# Patient Record
Sex: Female | Born: 1955 | Race: White | Hispanic: No | Smoking: Never smoker
Health system: Southern US, Community
[De-identification: ages and names within clinical notes are randomized; demographics above are authoritative.]

## PROBLEM LIST (undated history)

## (undated) DIAGNOSIS — C50919 Malignant neoplasm of unspecified site of unspecified female breast: Principal | ICD-10-CM

## (undated) DIAGNOSIS — F419 Anxiety disorder, unspecified: Secondary | ICD-10-CM

## (undated) DIAGNOSIS — E785 Hyperlipidemia, unspecified: Secondary | ICD-10-CM

## (undated) DIAGNOSIS — M1990 Unspecified osteoarthritis, unspecified site: Secondary | ICD-10-CM

## (undated) DIAGNOSIS — K219 Gastro-esophageal reflux disease without esophagitis: Secondary | ICD-10-CM

## (undated) DIAGNOSIS — C55 Malignant neoplasm of uterus, part unspecified: Secondary | ICD-10-CM

## (undated) DIAGNOSIS — Z923 Personal history of irradiation: Secondary | ICD-10-CM

## (undated) HISTORY — DX: Unspecified osteoarthritis, unspecified site: M19.90

## (undated) HISTORY — DX: Hyperlipidemia, unspecified: E78.5

## (undated) HISTORY — PX: FINGER SURGERY: SHX640

## (undated) HISTORY — PX: ABDOMINAL HYSTERECTOMY: SHX81

## (undated) HISTORY — DX: Malignant neoplasm of uterus, part unspecified: C55

## (undated) HISTORY — DX: Gastro-esophageal reflux disease without esophagitis: K21.9

## (undated) HISTORY — DX: Anxiety disorder, unspecified: F41.9

## (undated) HISTORY — PX: CHOLECYSTECTOMY: SHX55

## (undated) HISTORY — DX: Malignant neoplasm of unspecified site of unspecified female breast: C50.919

---

## 1999-02-18 ENCOUNTER — Encounter: Admission: RE | Admit: 1999-02-18 | Discharge: 1999-02-18 | Payer: Self-pay | Admitting: Obstetrics and Gynecology

## 2000-03-22 ENCOUNTER — Encounter: Admission: RE | Admit: 2000-03-22 | Discharge: 2000-03-22 | Payer: Self-pay | Admitting: Obstetrics and Gynecology

## 2000-03-22 ENCOUNTER — Encounter: Payer: Self-pay | Admitting: Obstetrics and Gynecology

## 2000-08-10 ENCOUNTER — Ambulatory Visit (HOSPITAL_COMMUNITY): Admission: RE | Admit: 2000-08-10 | Discharge: 2000-08-10 | Payer: Self-pay | Admitting: Internal Medicine

## 2000-08-10 ENCOUNTER — Encounter: Payer: Self-pay | Admitting: Internal Medicine

## 2001-07-24 ENCOUNTER — Ambulatory Visit (HOSPITAL_COMMUNITY): Admission: RE | Admit: 2001-07-24 | Discharge: 2001-07-24 | Payer: Self-pay | Admitting: *Deleted

## 2004-10-09 ENCOUNTER — Other Ambulatory Visit: Admission: RE | Admit: 2004-10-09 | Discharge: 2004-10-09 | Payer: Self-pay

## 2004-10-20 ENCOUNTER — Encounter: Admission: RE | Admit: 2004-10-20 | Discharge: 2004-10-20 | Payer: Self-pay

## 2005-03-08 ENCOUNTER — Emergency Department (HOSPITAL_COMMUNITY): Admission: EM | Admit: 2005-03-08 | Discharge: 2005-03-08 | Payer: Self-pay | Admitting: Emergency Medicine

## 2005-04-21 ENCOUNTER — Encounter (INDEPENDENT_AMBULATORY_CARE_PROVIDER_SITE_OTHER): Payer: Self-pay | Admitting: Specialist

## 2005-04-21 ENCOUNTER — Ambulatory Visit (HOSPITAL_COMMUNITY): Admission: RE | Admit: 2005-04-21 | Discharge: 2005-04-21 | Payer: Self-pay

## 2007-05-11 DIAGNOSIS — C50919 Malignant neoplasm of unspecified site of unspecified female breast: Secondary | ICD-10-CM

## 2007-05-11 DIAGNOSIS — Z923 Personal history of irradiation: Secondary | ICD-10-CM

## 2007-05-11 HISTORY — PX: BREAST LUMPECTOMY: SHX2

## 2007-05-11 HISTORY — DX: Malignant neoplasm of unspecified site of unspecified female breast: C50.919

## 2007-05-11 HISTORY — DX: Personal history of irradiation: Z92.3

## 2007-10-03 ENCOUNTER — Encounter: Admission: RE | Admit: 2007-10-03 | Discharge: 2007-10-03 | Payer: Self-pay | Admitting: Obstetrics and Gynecology

## 2007-10-03 ENCOUNTER — Encounter (INDEPENDENT_AMBULATORY_CARE_PROVIDER_SITE_OTHER): Payer: Self-pay | Admitting: Diagnostic Radiology

## 2007-10-12 ENCOUNTER — Encounter: Admission: RE | Admit: 2007-10-12 | Discharge: 2007-10-12 | Payer: Self-pay | Admitting: Obstetrics and Gynecology

## 2007-10-20 ENCOUNTER — Ambulatory Visit (HOSPITAL_BASED_OUTPATIENT_CLINIC_OR_DEPARTMENT_OTHER): Admission: RE | Admit: 2007-10-20 | Discharge: 2007-10-20 | Payer: Self-pay | Admitting: General Surgery

## 2007-10-20 ENCOUNTER — Encounter: Admission: RE | Admit: 2007-10-20 | Discharge: 2007-10-20 | Payer: Self-pay | Admitting: General Surgery

## 2007-10-20 ENCOUNTER — Encounter (HOSPITAL_BASED_OUTPATIENT_CLINIC_OR_DEPARTMENT_OTHER): Payer: Self-pay | Admitting: General Surgery

## 2007-11-16 ENCOUNTER — Ambulatory Visit: Payer: Self-pay | Admitting: Oncology

## 2007-11-27 ENCOUNTER — Ambulatory Visit: Admission: RE | Admit: 2007-11-27 | Discharge: 2008-01-31 | Payer: Self-pay | Admitting: Radiation Oncology

## 2007-12-11 ENCOUNTER — Encounter: Admission: RE | Admit: 2007-12-11 | Discharge: 2007-12-11 | Payer: Self-pay | Admitting: Radiation Oncology

## 2008-03-04 ENCOUNTER — Ambulatory Visit: Payer: Self-pay | Admitting: Oncology

## 2008-04-29 ENCOUNTER — Ambulatory Visit: Payer: Self-pay | Admitting: Oncology

## 2008-09-16 ENCOUNTER — Encounter: Admission: RE | Admit: 2008-09-16 | Discharge: 2008-09-16 | Payer: Self-pay | Admitting: Oncology

## 2008-12-23 ENCOUNTER — Ambulatory Visit: Payer: Self-pay | Admitting: Oncology

## 2008-12-23 LAB — COMPREHENSIVE METABOLIC PANEL
ALT: 17 U/L (ref 0–35)
AST: 15 U/L (ref 0–37)
Chloride: 104 mEq/L (ref 96–112)
Creatinine, Ser: 0.72 mg/dL (ref 0.40–1.20)
Glucose, Bld: 86 mg/dL (ref 70–99)
Potassium: 4.1 mEq/L (ref 3.5–5.3)
Sodium: 138 mEq/L (ref 135–145)
Total Bilirubin: 0.5 mg/dL (ref 0.3–1.2)
Total Protein: 7.4 g/dL (ref 6.0–8.3)

## 2008-12-23 LAB — LACTATE DEHYDROGENASE: LDH: 164 U/L (ref 94–250)

## 2008-12-23 LAB — CBC WITH DIFFERENTIAL/PLATELET
BASO%: 0.4 % (ref 0.0–2.0)
HCT: 34.8 % (ref 34.8–46.6)
HGB: 11.8 g/dL (ref 11.6–15.9)
MCV: 87 fL (ref 79.5–101.0)
MONO#: 0.2 10*3/uL (ref 0.1–0.9)
NEUT#: 2.2 10*3/uL (ref 1.5–6.5)
NEUT%: 66 % (ref 38.4–76.8)
RBC: 4 10*6/uL (ref 3.70–5.45)
WBC: 3.4 10*3/uL — ABNORMAL LOW (ref 3.9–10.3)
lymph#: 0.9 10*3/uL (ref 0.9–3.3)

## 2009-01-15 LAB — ESTRADIOL, ULTRA SENS

## 2009-02-19 ENCOUNTER — Ambulatory Visit: Payer: Self-pay | Admitting: Oncology

## 2009-06-18 ENCOUNTER — Encounter: Admission: RE | Admit: 2009-06-18 | Discharge: 2009-06-18 | Payer: Self-pay | Admitting: Rheumatology

## 2009-06-25 ENCOUNTER — Ambulatory Visit: Payer: Self-pay | Admitting: Oncology

## 2009-06-27 LAB — COMPREHENSIVE METABOLIC PANEL
ALT: 22 U/L (ref 0–35)
AST: 21 U/L (ref 0–37)
Albumin: 4 g/dL (ref 3.5–5.2)
CO2: 26 mEq/L (ref 19–32)
Calcium: 9 mg/dL (ref 8.4–10.5)
Chloride: 106 mEq/L (ref 96–112)
Potassium: 4 mEq/L (ref 3.5–5.3)
Sodium: 138 mEq/L (ref 135–145)
Total Bilirubin: 0.7 mg/dL (ref 0.3–1.2)

## 2009-06-27 LAB — CBC WITH DIFFERENTIAL/PLATELET
EOS%: 0.6 % (ref 0.0–7.0)
HCT: 34.1 % — ABNORMAL LOW (ref 34.8–46.6)
LYMPH%: 26.4 % (ref 14.0–49.7)
MCHC: 34.2 g/dL (ref 31.5–36.0)
NEUT#: 3.1 10*3/uL (ref 1.5–6.5)
NEUT%: 65.8 % (ref 38.4–76.8)
WBC: 4.7 10*3/uL (ref 3.9–10.3)

## 2009-07-03 LAB — ESTRADIOL, ULTRA SENS: Estradiol, Ultra Sensitive: 2 pg/mL

## 2009-09-15 ENCOUNTER — Encounter: Admission: RE | Admit: 2009-09-15 | Discharge: 2009-09-15 | Payer: Self-pay | Admitting: Oncology

## 2010-05-31 ENCOUNTER — Encounter: Payer: Self-pay | Admitting: Obstetrics and Gynecology

## 2010-07-02 ENCOUNTER — Other Ambulatory Visit: Payer: Self-pay | Admitting: Oncology

## 2010-07-02 ENCOUNTER — Encounter (HOSPITAL_BASED_OUTPATIENT_CLINIC_OR_DEPARTMENT_OTHER): Payer: BC Managed Care – PPO | Admitting: Oncology

## 2010-07-02 DIAGNOSIS — D0590 Unspecified type of carcinoma in situ of unspecified breast: Secondary | ICD-10-CM

## 2010-07-02 DIAGNOSIS — Z170 Estrogen receptor positive status [ER+]: Secondary | ICD-10-CM

## 2010-07-02 LAB — COMPREHENSIVE METABOLIC PANEL WITH GFR
ALT: 22 U/L (ref 0–35)
AST: 20 U/L (ref 0–37)
Albumin: 4.1 g/dL (ref 3.5–5.2)
Alkaline Phosphatase: 78 U/L (ref 39–117)
BUN: 12 mg/dL (ref 6–23)
CO2: 27 meq/L (ref 19–32)
Calcium: 9 mg/dL (ref 8.4–10.5)
Chloride: 103 meq/L (ref 96–112)
Creatinine, Ser: 0.8 mg/dL (ref 0.40–1.20)
Glucose, Bld: 91 mg/dL (ref 70–99)
Potassium: 4.1 meq/L (ref 3.5–5.3)
Sodium: 138 meq/L (ref 135–145)
Total Bilirubin: 0.7 mg/dL (ref 0.3–1.2)
Total Protein: 7.3 g/dL (ref 6.0–8.3)

## 2010-07-02 LAB — CBC WITH DIFFERENTIAL/PLATELET
EOS%: 0.9 % (ref 0.0–7.0)
Eosinophils Absolute: 0 10*3/uL (ref 0.0–0.5)
HCT: 36 % (ref 34.8–46.6)
LYMPH%: 31.1 % (ref 14.0–49.7)
MCV: 88.7 fL (ref 79.5–101.0)
MONO#: 0.3 10*3/uL (ref 0.1–0.9)
MONO%: 6.1 % (ref 0.0–14.0)
NEUT#: 2.7 10*3/uL (ref 1.5–6.5)
NEUT%: 61.7 % (ref 38.4–76.8)
WBC: 4.4 10*3/uL (ref 3.9–10.3)
lymph#: 1.4 10*3/uL (ref 0.9–3.3)

## 2010-07-02 LAB — LACTATE DEHYDROGENASE: LDH: 139 U/L (ref 94–250)

## 2010-08-11 ENCOUNTER — Encounter (HOSPITAL_BASED_OUTPATIENT_CLINIC_OR_DEPARTMENT_OTHER): Payer: BC Managed Care – PPO | Admitting: Oncology

## 2010-08-11 DIAGNOSIS — G8929 Other chronic pain: Secondary | ICD-10-CM

## 2010-08-11 DIAGNOSIS — D0590 Unspecified type of carcinoma in situ of unspecified breast: Secondary | ICD-10-CM

## 2010-08-11 DIAGNOSIS — Z170 Estrogen receptor positive status [ER+]: Secondary | ICD-10-CM

## 2010-08-12 ENCOUNTER — Other Ambulatory Visit: Payer: Self-pay | Admitting: Oncology

## 2010-08-12 DIAGNOSIS — Z9889 Other specified postprocedural states: Secondary | ICD-10-CM

## 2010-09-17 ENCOUNTER — Other Ambulatory Visit: Payer: Self-pay | Admitting: Oncology

## 2010-09-17 ENCOUNTER — Ambulatory Visit
Admission: RE | Admit: 2010-09-17 | Discharge: 2010-09-17 | Disposition: A | Discharge status: Home / Self Care | Payer: BC Managed Care – PPO | Source: Ambulatory Visit | Attending: Oncology | Admitting: Oncology

## 2010-09-17 DIAGNOSIS — Z9889 Other specified postprocedural states: Secondary | ICD-10-CM

## 2010-09-17 DIAGNOSIS — C50919 Malignant neoplasm of unspecified site of unspecified female breast: Secondary | ICD-10-CM

## 2010-09-22 NOTE — Op Note (Signed)
NAMESHANICA, CASTELLANOS            ACCOUNT NO.:  [id number]   MEDICAL RECORD NO.:  [id number]          PATIENT TYPE:  AMB   LOCATION:  DSC                          FACILITY:  [facility]   PHYSICIAN:  Leonie Man, M.D.   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   PROCEDURE:  Needle localized right lumpectomy.   SURGEON:  Leonie Man, MD   ASSISTANT:  OR nurse.   ANESTHESIA:  General.   SPECIMENS:  To lab, right breast tissue.   ESTIMATED BLOOD LOSS:  Minimal.   COMPLICATIONS:  There were no complications.  The patient returned to  the PACU in excellent condition.   INDICATIONS:  Ms. 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  Subsequent MRI did  not demonstrate abnormal lesions.  The proliferation markers on this  tumor shows to be highly estrogen and progesterone receptor positive.   The patient comes now for needle localized excision of this tumor in  situ.  She is aware of the risks and potential benefits of surgery and  gives her consent to same.   PROCEDURE:  Following induction of satisfactory general anesthesia with  the patient positioned supinely, the right breast was prepped and draped  to be included in a sterile operative field.  Positive identification of  the patient as [place] Vierling and the operation to be right breast  excision was carried out.  The localizing needle was observed to enter  from the medial aspect of the breast extending into the central portion  of the breast and the upper inner quadrant.  I  made a transverse  incision extending from the needle medially, deepening this through skin  and subcutaneous tissue and raising a superior medial, inferior, and  lateral flaps, so as to have a complete excision of the mass.  This  excision was carried all the way down to the chest wall.  The localizing  needle was also embedded into the pectoralis major muscle and this was  simply removed at that time.  I placed a long suture on the medial  aspect of the specimen and a double suture on the inferior aspect of the  specimen.  Specimen mammography shows the clipped to be well within and  inside the removed specimen and the lesion could be identified.  This  was then forwarded to radiology identify to for pathologic evaluation.  Hemostasis within the wound was assured with electrocautery.  Sponge and  instrument counts verified.  The breast tissues were reapproximated with  interrupted 2-0  Vicryl sutures.  Subcutaneous tissues closed with 3-0 Vicryl suture and  the skin closed with 5-0 Monocryl and then reinforced with Dermabond  and  Steri-Strips.  The patient has a history of latex allergy and it should  be noted that all latex allergic procedures were carried out during this  process procedure.        Leonie Man, M.D.  Electronically Signed     PB/MEDQ  D:  10/20/2007  T:  10/21/2007  Job:  [id number]

## 2011-02-04 LAB — DIFFERENTIAL
Basophils Absolute: 0
Basophils Relative: 1
Eosinophils Relative: 1
Lymphocytes Relative: 35
Monocytes Absolute: 0.3
Monocytes Relative: 6
Neutro Abs: 2.5

## 2011-02-04 LAB — CBC
MCHC: 34.4
MCV: 90.4
RBC: 4
RDW: 13.6

## 2011-02-04 LAB — COMPREHENSIVE METABOLIC PANEL
AST: 21
Albumin: 3.9
Alkaline Phosphatase: 61
Chloride: 108
GFR calc Af Amer: >60
Potassium: 4.7
Sodium: 140
Total Bilirubin: 0.6
Total Protein: 7

## 2011-02-15 ENCOUNTER — Other Ambulatory Visit: Payer: Self-pay | Admitting: Dermatology

## 2011-06-21 ENCOUNTER — Other Ambulatory Visit: Payer: Self-pay | Admitting: Certified Registered Nurse Anesthetist

## 2011-06-21 DIAGNOSIS — C50919 Malignant neoplasm of unspecified site of unspecified female breast: Secondary | ICD-10-CM

## 2011-06-21 MED ORDER — BUPROPION HCL ER (XL) 150 MG PO TB24: 150.0000 mg | ORAL_TABLET | Freq: Every day | ORAL | Status: DC

## 2011-06-21 MED ORDER — ANASTROZOLE 1 MG PO TABS: 1.0000 mg | ORAL_TABLET | Freq: Every day | ORAL | Status: DC

## 2011-07-28 ENCOUNTER — Other Ambulatory Visit: Payer: Self-pay | Admitting: Oncology

## 2011-07-28 DIAGNOSIS — Z853 Personal history of malignant neoplasm of breast: Secondary | ICD-10-CM

## 2011-07-28 DIAGNOSIS — Z9889 Other specified postprocedural states: Secondary | ICD-10-CM

## 2011-08-05 ENCOUNTER — Other Ambulatory Visit: Payer: BC Managed Care – PPO | Admitting: Lab

## 2011-08-12 ENCOUNTER — Telehealth: Payer: Self-pay | Admitting: *Deleted

## 2011-08-12 ENCOUNTER — Ambulatory Visit: Payer: BC Managed Care – PPO | Admitting: Physician Assistant

## 2011-08-12 NOTE — Telephone Encounter (Signed)
patient called in and confirmed her appointment over the phone on 08-12-2011

## 2011-08-19 ENCOUNTER — Telehealth: Payer: Self-pay | Admitting: *Deleted

## 2011-08-19 ENCOUNTER — Ambulatory Visit (HOSPITAL_BASED_OUTPATIENT_CLINIC_OR_DEPARTMENT_OTHER): Payer: BC Managed Care – PPO | Admitting: Physician Assistant

## 2011-08-19 ENCOUNTER — Ambulatory Visit (HOSPITAL_BASED_OUTPATIENT_CLINIC_OR_DEPARTMENT_OTHER): Payer: BC Managed Care – PPO

## 2011-08-19 VITALS — BP 123/77 | HR 69 | Temp 98.3°F | Ht 67.0 in | Wt 162.0 lb

## 2011-08-19 DIAGNOSIS — C50911 Malignant neoplasm of unspecified site of right female breast: Secondary | ICD-10-CM

## 2011-08-19 DIAGNOSIS — Z79811 Long term (current) use of aromatase inhibitors: Secondary | ICD-10-CM

## 2011-08-19 DIAGNOSIS — Z170 Estrogen receptor positive status [ER+]: Secondary | ICD-10-CM

## 2011-08-19 DIAGNOSIS — D0590 Unspecified type of carcinoma in situ of unspecified breast: Secondary | ICD-10-CM

## 2011-08-19 DIAGNOSIS — E559 Vitamin D deficiency, unspecified: Secondary | ICD-10-CM

## 2011-08-19 DIAGNOSIS — C50919 Malignant neoplasm of unspecified site of unspecified female breast: Secondary | ICD-10-CM

## 2011-08-19 LAB — CBC WITH DIFFERENTIAL/PLATELET
BASO%: 0.6 % (ref 0.0–2.0)
Basophils Absolute: 0 10*3/uL (ref 0.0–0.1)
EOS%: 1.1 % (ref 0.0–7.0)
HCT: 36.8 % (ref 34.8–46.6)
HGB: 12.3 g/dL (ref 11.6–15.9)
MCH: 29.5 pg (ref 25.1–34.0)
MCHC: 33.6 g/dL (ref 31.5–36.0)
MCV: 87.9 fL (ref 79.5–101.0)
MONO%: 5.7 % (ref 0.0–14.0)
NEUT%: 61.8 % (ref 38.4–76.8)
lymph#: 1.2 10*3/uL (ref 0.9–3.3)

## 2011-08-19 NOTE — Telephone Encounter (Signed)
sent patient back to the lab on 08-19-2011 gave patient appointment for 08-2012 printed out  calendar and gave the patient

## 2011-08-19 NOTE — Progress Notes (Signed)
Hematology and Oncology Follow Up Visit  Ashley Norton [id number] 1956-03-05 56 y.o. 08/19/2011    HPI: 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 She has previously been on tamoxifen but didn't tolerate switched to Arimidex 1 mg by mouth daily. 2. 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 She continues on anastrozole 1 mg per day and is tolerating it quite well. She notes that Peridin-C continues to control her hot flashes well, though she is considering increasing it from 2 tablets to 3-4 per day. She denies any unexplained fevers, chills, or night sweats. No nausea, emesis, diarrhea or constipation issues. She is due for her annual mammogram on 09/20/2011. She occasionally has a discomfort in the lateral aspect of the right breast. Overall she feels quite well. 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 She admits that she is not able to exercise on a regular basis, but that she is on her feet very much all day.  A detailed review of systems is otherwise noncontributory as noted below.  Review of Systems: Constitutional:  no weight loss, fever, night sweats and feels well Eyes: No compliants ENT: No complaints Cardiovascular: no chest pain or dyspnea on exertion Respiratory: no cough, shortness of breath, or wheezing Neurological: no TIA or stroke symptoms Dermatological: negative Gastrointestinal: no abdominal pain, change in bowel habits, or black or bloody stools Genito-Urinary: no dysuria, trouble voiding, or hematuria Hematological and Lymphatic: negative Breast: negative Musculoskeletal: negative Remaining ROS negative.   Medications:   I have  reviewed the patient's current medications.  Current Outpatient Prescriptions  Medication Sig Dispense Refill  . anastrozole (ARIMIDEX) 1 MG tablet Take 1 mg by mouth daily.      Marland Kitchen buPROPion (WELLBUTRIN XL) 150 MG 24 hr tablet Take 1 tablet (150 mg total) by mouth daily.  90 tablet  4    Allergies: No Known Allergies  Physical Exam: Filed Vitals:   08/19/11 0946  BP: 123/77  Pulse: 69  Temp: 98.3 F (36.8 C)    Body mass index is 25.37 kg/(m^2). Weight: 162 lbs. HEENT:  Sclerae anicteric, conjunctivae pink.  Oropharynx clear.  No mucositis or candidiasis.   Nodes:  No cervical, supraclavicular, or axillary lymphadenopathy palpated.  Breast Exam: The right breast was examined, the lumpectomy scar in the central portion of the breast is benign without any dominant masses no nipple inversion or discharge. The axilla is clear. The left breast is also free of masses skin changes nipple inversion or discharge. No axillary fullness. Lungs:  Clear to auscultation bilaterally.  No crackles, rhonchi, or wheezes.   Heart:  Regular rate and rhythm.   Abdomen:  Soft, nontender.  Positive bowel sounds.  No organomegaly or masses palpated.   Musculoskeletal:  No focal spinal tenderness to palpation.  Extremities:  Benign.  No peripheral edema or cyanosis.   Skin:  Benign.   Neuro:  Nonfocal, alert and oriented x 3.   Lab Results: Lab Results  Component Value Date   WBC 4.4 07/02/2010   HGB 12.0 07/02/2010   HCT 36.0 07/02/2010   MCV 88.7 07/02/2010   PLT 251 07/02/2010   NEUTROABS 2.7 07/02/2010  Chemistry      Component Value Date/Time   NA 138 07/02/2010 1607   K 4.1 07/02/2010 1607   CL 103 07/02/2010 1607   CO2 27 07/02/2010 1607   BUN 12 07/02/2010 1607   CREATININE 0.80 07/02/2010 1607      Component Value Date/Time   CALCIUM 9.0 07/02/2010 1607   ALKPHOS 78 07/02/2010 1607   AST 20 07/02/2010 1607   ALT 22 07/02/2010 1607   BILITOT 0.7 07/02/2010 1607      No results found for  this basename: LABCA2    Assessment:  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 She has previously been on tamoxifen but didn't tolerate switched to Arimidex 1 mg by mouth daily.  No evidence of recurrent or metastatic disease. 2. History of hot flashes, on Peridin-C.  Case reviewed with Dr. Pierce Crane.   Plan:  Ashley Norton will continue on anastrozole 1 mg per day. I have provided a prescription for Peridin-C, so that she may be able to use her Medflex card. She will keep her appointment for her mammogram is scheduled in May of 2013, she will return to see [place] officially in one year's time. Also note, I will send her back to our lab today so that we may obtain a CBC, serum chemistry, LDH, CA 27-29, and vitamin D level.  This plan was reviewed with the patient, who voices understanding and agreement.  She knows to call with any changes or problems.    Ashley Norton T, PA-C 08/19/2011

## 2011-08-20 LAB — CANCER ANTIGEN 27.29: CA 27.29: 28 U/mL (ref 0–39)

## 2011-08-20 LAB — COMPREHENSIVE METABOLIC PANEL
AST: 15 U/L (ref 0–37)
Alkaline Phosphatase: 96 U/L (ref 39–117)
BUN: 12 mg/dL (ref 6–23)
Creatinine, Ser: 0.82 mg/dL (ref 0.50–1.10)
Glucose, Bld: 89 mg/dL (ref 70–99)
Total Bilirubin: 0.6 mg/dL (ref 0.3–1.2)

## 2011-08-20 LAB — VITAMIN D 25 HYDROXY (VIT D DEFICIENCY, FRACTURES): Vit D, 25-Hydroxy: 48 ng/mL (ref 30–89)

## 2011-09-20 ENCOUNTER — Ambulatory Visit
Admission: RE | Admit: 2011-09-20 | Discharge: 2011-09-20 | Disposition: A | Discharge status: Home / Self Care | Payer: BC Managed Care – PPO | Source: Ambulatory Visit | Attending: Oncology | Admitting: Oncology

## 2011-09-20 DIAGNOSIS — Z853 Personal history of malignant neoplasm of breast: Secondary | ICD-10-CM

## 2011-09-20 DIAGNOSIS — Z9889 Other specified postprocedural states: Secondary | ICD-10-CM

## 2012-04-10 ENCOUNTER — Telehealth: Payer: Self-pay | Admitting: *Deleted

## 2012-04-10 NOTE — Telephone Encounter (Signed)
Mailed out calendar to inform the patient of the new date and time md on vac 

## 2012-06-02 ENCOUNTER — Telehealth: Payer: Self-pay | Admitting: *Deleted

## 2012-06-02 ENCOUNTER — Encounter: Payer: Self-pay | Admitting: Oncology

## 2012-06-02 NOTE — Telephone Encounter (Signed)
Pt called stating that she has not received a letter, but did here that Dr. Donnie Coffin is no longer here and she needs medicine.  Confirmed 06/05/12 appt w/ pt.

## 2012-06-04 ENCOUNTER — Other Ambulatory Visit: Payer: Self-pay | Admitting: Oncology

## 2012-06-05 ENCOUNTER — Encounter: Payer: Self-pay | Admitting: Family

## 2012-06-05 ENCOUNTER — Ambulatory Visit (HOSPITAL_BASED_OUTPATIENT_CLINIC_OR_DEPARTMENT_OTHER): Payer: BC Managed Care – PPO | Admitting: Family

## 2012-06-05 ENCOUNTER — Ambulatory Visit: Payer: BC Managed Care – PPO | Admitting: Physician Assistant

## 2012-06-05 ENCOUNTER — Telehealth: Payer: Self-pay | Admitting: Oncology

## 2012-06-05 VITALS — BP 128/74 | HR 75 | Temp 98.4°F | Resp 20 | Ht 67.0 in | Wt 166.1 lb

## 2012-06-05 DIAGNOSIS — R61 Generalized hyperhidrosis: Secondary | ICD-10-CM

## 2012-06-05 DIAGNOSIS — Z853 Personal history of malignant neoplasm of breast: Secondary | ICD-10-CM | POA: Insufficient documentation

## 2012-06-05 DIAGNOSIS — D0590 Unspecified type of carcinoma in situ of unspecified breast: Secondary | ICD-10-CM

## 2012-06-05 DIAGNOSIS — C50919 Malignant neoplasm of unspecified site of unspecified female breast: Secondary | ICD-10-CM

## 2012-06-05 DIAGNOSIS — Z170 Estrogen receptor positive status [ER+]: Secondary | ICD-10-CM

## 2012-06-05 MED ORDER — ANASTROZOLE 1 MG PO TABS: 1.0000 mg | ORAL_TABLET | Freq: Every day | ORAL | Status: DC

## 2012-06-05 MED ORDER — BUPROPION HCL ER (XL) 150 MG PO TB24: 150.0000 mg | ORAL_TABLET | Freq: Every day | ORAL | Status: DC

## 2012-06-05 MED ORDER — GABAPENTIN 300 MG PO CAPS: 300.0000 mg | ORAL_CAPSULE | Freq: Every day | ORAL | Status: DC

## 2012-06-05 NOTE — Progress Notes (Signed)
ID: [name]   DOB: [birth year]  MR#: [id number]  NWG#:[id number]   PCP: Pearla Dubonnet, MD GYN: Zelphia Cairo, MD SU:  OTHER MD: Margaretmary Bayley, MD   HISTORY OF PRESENT ILLNESS: This patient has been in good health.  She undergoes annual screening mammography.  She has had multiple cyst aspirations in the past as well as biopsies of her breast.  She underwent a mammogram on 09/04/2007.  Right breast calcifications were seen at 3 o'clock position.  A biopsy was recommended.  A biopsy showed DCIS of low-grade.  This was strongly ER and PR positive at 98% and 100% respectively.  She had a followup MRI scan essentially which was unremarkable.  No evidence of abnormality was seen in either breast.  Patient underwent a needle localized lumpectomy on 10/20/2007.  Final pathology showed ductal hyperplasia and fibrocystic changes with negative surgical margins.  She has had an unremarkable postoperative course. Her subsequent course is as detailed below.   INTERVAL HISTORY: [name] was seen by Dr. 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 She continues on Anastrozole 1 mg per day and is tolerating it quite well. She continues to use Peridin-C for daytime hot flashes in addition to Bupropion.  She notes an increase in night sweats.  [name] states that she had a recent "head cold" and non-productive cough since 05/10/2012.  The patient went to [facility] for her "head cold"  and was given a prescription for Tessalon Perles and Doxycycline twice a day for 7 days. The patient states that although her symptoms have improved, they've not completely resolved. Dr. Darnelle Catalan suggested that the patient take Claritin for her ongoing symptoms.  [name] was also concerned about vaginal dryness.  Replens, K-Y jelly, Astroglide and coconut oil were all suggested as possible vaginal dryness remedies for [name].   Dr. Darnelle Catalan also offered for the patient to change breast cancer therapies back to Tamoxifen where he could order an estrogen vaginal cream. Mrs. Boer denied the latter option and states that she may try coconut oil for the vaginal dryness. [name] denies any other symptomatology during today's office visit including any breast changes, shortness of breath, fever, chills, unusual bleeding, nausea/vomiting/diarrhea or constipation.  REVIEW OF SYSTEMS: Negative except for:  General ROS: positive for  - hot flashes and night sweats ENT ROS: positive for - nasal congestion and sore throat Respiratory ROS: positive for - cough Genito-Urinary ROS: positive for - vaginal dryness MS ROS: positive for joint pain  PAST MEDICAL HISTORY: Past Medical History  Diagnosis Date  . Breast cancer 2009  . Uterine cancer     At age 75   History of herpes.  Includes previous excision of benign tumor left breast in 1993 as well as two years ago by Dr. Orson Slick.  She has had multiple cyst aspirations as noted.  She has had a gallbladder surgery by Dr. Cathey Endow a number of years ago.  In addition, she has had multiple moles removed, one from her right leg about two years ago, one from her right buttock in 2006 by Dr. Yetta Barre.  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  Abdominal hysterectomy for what sounds like early uterine cancer with intact ovaries at age 89.  PAST SURGICAL HISTORY: Past Surgical History  Procedure Date  . Cholecystectomy   . Breast lumpectomy  FAMILY HISTORY Family History  Problem Relation Age of Onset  . Hypertension Mother   . Cancer Father    Patient's father had colon cancer, died in 21.  She has two brothers in good health.  Mother, is in good health as well.  GYNECOLOGIC HISTORY: She is G1, P35, menarche age 30.  She has been on hormone replacement therapy and was using patches.    SOCIAL HISTORY: Patient is  married to her current husband for the past 31 years.  This is her second marriage.  Her daughter is from her first marriage, which lasted a year-and-a-half.  She is a grandmother.  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  Her husband is a retired Emergency planning/management officer.   ADVANCED DIRECTIVES:  Not on file.  HEALTH MAINTENANCE: History  Substance Use Topics  . Smoking status: Never Smoker   . Smokeless tobacco: Never Used  . Alcohol Use: Yes     Comment: Occasionally    No Known Allergies  Current Outpatient Prescriptions  Medication Sig Dispense Refill  . anastrozole (ARIMIDEX) 1 MG tablet Take 1 tablet (1 mg total) by mouth daily.  90 tablet  1  . buPROPion (WELLBUTRIN XL) 150 MG 24 hr tablet Take 1 tablet (150 mg total) by mouth daily.  90 tablet  4  . gabapentin (NEURONTIN) 300 MG capsule Take 1 capsule (300 mg total) by mouth at bedtime.  90 capsule  4    OBJECTIVE: Filed Vitals:   06/05/12 0841  BP: 128/74  Pulse: 75  Temp: 98.4 F (36.9 C)  Resp: 20     Body mass index is 26.01 kg/(m^2).     ECOG FS:  Grade 0 - Fully active  General appearance: Alert, cooperative, well nourished, no apparent distress Head: Normocephalic, without obvious abnormality, atraumatic Eyes: Conjunctivae/corneas clear, PERRLA, EOMI Nose: Nares, septum and mucosa are normal, no drainage or sinus tenderness. Neck: No adenopathy, supple, symmetrical, trachea midline, thyroid not enlarged, no tenderness Resp: Clear to auscultation bilaterally Cardio: Regular rate and rhythm, S1, S2 normal, no murmur, click, rub or gallop Breasts:  Soft bilaterally, well-healed right breast lumpectomy scar, no lymphadenopathy, no nipple inversion, no axilla fullness GI: Soft, distended, non-tender, hypoactive bowel sounds, no organomegaly Extremities: Extremities normal, atraumatic, no cyanosis or edema Lymph nodes: Cervical, supraclavicular, and axillary nodes normal Neurologic:  Grossly normal    LAB RESULTS: Lab Results  Component Value Date   WBC 4.0 08/19/2011   NEUTROABS 2.5 08/19/2011   HGB 12.3 08/19/2011   HCT 36.8 08/19/2011   MCV 87.9 08/19/2011   PLT 251 08/19/2011      Chemistry      Component Value Date/Time   NA 136 08/19/2011 1118   K 4.4 08/19/2011 1118   CL 103 08/19/2011 1118   CO2 27 08/19/2011 1118   BUN 12 08/19/2011 1118   CREATININE 0.82 08/19/2011 1118      Component Value Date/Time   CALCIUM 9.4 08/19/2011 1118   ALKPHOS 96 08/19/2011 1118   AST 15 08/19/2011 1118   ALT 21 08/19/2011 1118   BILITOT 0.6 08/19/2011 1118      Lab Results  Component Value Date   LABCA2 28 08/19/2011    Urinalysis No results found for this basename: colorurine, appearanceur, labspec, phurine, glucoseu, hgbur, bilirubinur, ketonesur, proteinur, urobilinogen, nitrite, leukocytesur    STUDIES: No results found.  ASSESSMENT: 57 y.o. [place] woman  (1) status post right breast biopsy Oct 03 2007 for  ductal carcinoma in situ, low-grade, estrogen receptor 98% positive and progesterone receptor 100% positive   (2)  definitive right lumpectomy 10/20/2007 showed no additional tumor  (3) adjuvant radiation completed 01/18/2008    PLAN: (1) [name] will continue on Arimidex 1 mg PO daily.  This medication was started in  01/2008.  She was given a refill on this medication today  #90 + 3 refills  (2) The patient was given a prescription for Gabapentin 300 mg PO QHS, #90 x 4 refills.  (3)  The patient was given a prescription for Bupropion 150 mg PO daily #90 x 4 refills.   (3)  [name]'s digital diagnostic bilateral mammogram will be due in 09/2012.  (4)  [name] is scheduled for a follow up visit with Dr. Darnelle Catalan on 01/29/2013 with labs of CMP, CBC, LDH, vitamin D and CA 27.29 to be drawn in advance of the office visit.   Mrs. Sacco is encouraged to contact us in the interim if she has any questions or concerns.    Ashley Bras  NP-C   06/05/2012

## 2012-06-05 NOTE — Telephone Encounter (Signed)
gv pt appt schedule for September.  °

## 2012-06-05 NOTE — Patient Instructions (Signed)
Please contact us at [phone] if you have any questions or concerns. 

## 2012-08-22 ENCOUNTER — Other Ambulatory Visit: Payer: BC Managed Care – PPO | Admitting: Lab

## 2012-08-22 ENCOUNTER — Ambulatory Visit: Payer: BC Managed Care – PPO | Admitting: Oncology

## 2012-08-29 ENCOUNTER — Ambulatory Visit: Payer: BC Managed Care – PPO | Admitting: Oncology

## 2012-08-29 ENCOUNTER — Other Ambulatory Visit: Payer: BC Managed Care – PPO | Admitting: Lab

## 2012-08-31 ENCOUNTER — Other Ambulatory Visit: Payer: Self-pay | Admitting: Obstetrics and Gynecology

## 2012-08-31 DIAGNOSIS — Z9889 Other specified postprocedural states: Secondary | ICD-10-CM

## 2012-08-31 DIAGNOSIS — Z853 Personal history of malignant neoplasm of breast: Secondary | ICD-10-CM

## 2012-09-01 ENCOUNTER — Other Ambulatory Visit: Payer: Self-pay | Admitting: Otolaryngology

## 2012-10-18 ENCOUNTER — Ambulatory Visit
Admission: RE | Admit: 2012-10-18 | Discharge: 2012-10-18 | Disposition: A | Discharge status: Home / Self Care | Payer: BC Managed Care – PPO | Source: Ambulatory Visit | Attending: Obstetrics and Gynecology | Admitting: Obstetrics and Gynecology

## 2012-10-18 DIAGNOSIS — Z853 Personal history of malignant neoplasm of breast: Secondary | ICD-10-CM

## 2012-10-18 DIAGNOSIS — Z9889 Other specified postprocedural states: Secondary | ICD-10-CM

## 2012-11-24 ENCOUNTER — Other Ambulatory Visit: Payer: Self-pay | Admitting: Family

## 2012-11-28 ENCOUNTER — Other Ambulatory Visit: Payer: Self-pay | Admitting: *Deleted

## 2012-11-28 DIAGNOSIS — C50919 Malignant neoplasm of unspecified site of unspecified female breast: Secondary | ICD-10-CM

## 2012-11-28 MED ORDER — BUPROPION HCL ER (XL) 150 MG PO TB24: 150.0000 mg | ORAL_TABLET | Freq: Every day | ORAL | Status: DC

## 2012-11-28 MED ORDER — PERIDIN-C 200-50-150 MG PO TABS: ORAL_TABLET | ORAL | Status: DC

## 2012-11-28 MED ORDER — ANASTROZOLE 1 MG PO TABS: 1.0000 mg | ORAL_TABLET | Freq: Every day | ORAL | Status: DC

## 2012-12-07 ENCOUNTER — Telehealth: Payer: Self-pay | Admitting: Oncology

## 2012-12-07 NOTE — Telephone Encounter (Signed)
Pt called today and moved 9/22 f/u appt to 10/28 (next available). Pt has new d/t for f/u 10/28 @ 9:30am and will keep lab appt as scheduled for 9/15.

## 2012-12-18 ENCOUNTER — Other Ambulatory Visit: Payer: Self-pay | Admitting: Gastroenterology

## 2013-01-22 ENCOUNTER — Other Ambulatory Visit (HOSPITAL_BASED_OUTPATIENT_CLINIC_OR_DEPARTMENT_OTHER): Payer: BC Managed Care – PPO

## 2013-01-22 DIAGNOSIS — C50919 Malignant neoplasm of unspecified site of unspecified female breast: Secondary | ICD-10-CM

## 2013-01-22 DIAGNOSIS — D0590 Unspecified type of carcinoma in situ of unspecified breast: Secondary | ICD-10-CM

## 2013-01-22 LAB — CBC WITH DIFFERENTIAL/PLATELET
Basophils Absolute: 0 10*3/uL (ref 0.0–0.1)
EOS%: 1.2 % (ref 0.0–7.0)
Eosinophils Absolute: 0 10*3/uL (ref 0.0–0.5)
HCT: 35.3 % (ref 34.8–46.6)
HGB: 11.9 g/dL (ref 11.6–15.9)
MCH: 29.2 pg (ref 25.1–34.0)
MONO#: 0.3 10*3/uL (ref 0.1–0.9)
NEUT#: 2.3 10*3/uL (ref 1.5–6.5)
RDW: 13.5 % (ref 11.2–14.5)
WBC: 4 10*3/uL (ref 3.9–10.3)
lymph#: 1.4 10*3/uL (ref 0.9–3.3)

## 2013-01-22 LAB — COMPREHENSIVE METABOLIC PANEL (CC13)
ALT: 18 U/L (ref 0–55)
AST: 14 U/L (ref 5–34)
Calcium: 9 mg/dL (ref 8.4–10.4)
Chloride: 108 mEq/L (ref 98–109)
Creatinine: 0.9 mg/dL (ref 0.6–1.1)
Sodium: 142 mEq/L (ref 136–145)
Total Protein: 7.3 g/dL (ref 6.4–8.3)

## 2013-01-22 LAB — VITAMIN D 25 HYDROXY (VIT D DEFICIENCY, FRACTURES): Vit D, 25-Hydroxy: 49 ng/mL (ref 30–89)

## 2013-01-22 LAB — LACTATE DEHYDROGENASE (CC13): LDH: 171 U/L (ref 125–245)

## 2013-01-29 ENCOUNTER — Ambulatory Visit: Payer: BC Managed Care – PPO | Admitting: Oncology

## 2013-02-06 ENCOUNTER — Other Ambulatory Visit: Payer: Self-pay | Admitting: Dermatology

## 2013-02-20 ENCOUNTER — Other Ambulatory Visit: Payer: Self-pay | Admitting: Dermatology

## 2013-03-06 ENCOUNTER — Ambulatory Visit (HOSPITAL_BASED_OUTPATIENT_CLINIC_OR_DEPARTMENT_OTHER): Payer: BC Managed Care – PPO | Admitting: Oncology

## 2013-03-06 ENCOUNTER — Encounter (INDEPENDENT_AMBULATORY_CARE_PROVIDER_SITE_OTHER): Payer: Self-pay

## 2013-03-06 VITALS — BP 117/71 | HR 76 | Temp 98.2°F | Resp 18 | Ht 67.0 in | Wt 164.5 lb

## 2013-03-06 DIAGNOSIS — Z170 Estrogen receptor positive status [ER+]: Secondary | ICD-10-CM

## 2013-03-06 DIAGNOSIS — C50919 Malignant neoplasm of unspecified site of unspecified female breast: Secondary | ICD-10-CM

## 2013-03-06 DIAGNOSIS — N952 Postmenopausal atrophic vaginitis: Secondary | ICD-10-CM

## 2013-03-06 DIAGNOSIS — D0590 Unspecified type of carcinoma in situ of unspecified breast: Secondary | ICD-10-CM

## 2013-03-06 NOTE — Progress Notes (Signed)
ID: Ashley Norton   DOB: 12/29/1955  MR#: [id number]  ZHY#:[id number]   PCP: Pearla Dubonnet, MD GYN: Zelphia Cairo, MD SU:  OTHER MD: Margaretmary Bayley, MD, Karlyn Agee MD   HISTORY OF PRESENT ILLNESS: This patient has been in good health.  She undergoes annual screening mammography.  She has had multiple cyst aspirations in the past as well as biopsies of her breast.  She underwent a mammogram on 09/04/2007.  Right breast calcifications were seen at 3 o'clock position.  A biopsy was recommended.  A biopsy showed DCIS of low-grade.  This was strongly ER and PR positive at 98% and 100% respectively.  She had a followup MRI scan essentially which was unremarkable.  No evidence of abnormality was seen in either breast.  Patient underwent a needle localized lumpectomy on 10/20/2007.  Final pathology showed ductal hyperplasia and fibrocystic changes with negative surgical margins.  She has had an unremarkable postoperative course. Her subsequent course is as detailed below.   INTERVAL HISTORY: Ashley Norton was seen by Dr. 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 She continues on Anastrozole 1 mg per day and is tolerating it quite well. She continues to use Peridin-C for daytime hot flashes in addition to Bupropion.  She notes an increase in night sweats.  Ashley Norton states that she had a recent "head cold" and non-productive cough since 05/10/2012.  The patient went to [facility] for her "head cold"  and was given a prescription for Tessalon Perles and Doxycycline twice a day for 7 days. The patient states that although her symptoms have improved, they've not completely resolved. Dr. Darnelle Catalan suggested that the patient take Claritin for her ongoing symptoms.  Mrs. Cashin was also concerned about vaginal dryness.  Replens, K-Y jelly, Astroglide and coconut oil were all suggested as possible vaginal dryness remedies for  Ashley Norton.  Dr. Darnelle Catalan also offered for the patient to change breast cancer therapies back to Tamoxifen where he could order an estrogen vaginal cream. Ashley Norton denied the latter option and states that she may try coconut oil for the vaginal dryness. Ashley Norton denies any other symptomatology during today's office visit including any breast changes, shortness of breath, fever, chills, unusual bleeding, nausea/vomiting/diarrhea or constipation.  REVIEW OF SYSTEMS: Negative except for:  General ROS: positive for  - hot flashes and night sweats ENT ROS: positive for - nasal congestion and sore throat Respiratory ROS: positive for - cough Genito-Urinary ROS: positive for - vaginal dryness MS ROS: positive for joint pain  PAST MEDICAL HISTORY: Past Medical History  Diagnosis Date  . Breast cancer 2009  . Uterine cancer     At age 57   History of herpes.  Includes previous excision of benign tumor left breast in 1993 as well as two years ago by Dr. Orson Slick.  She has had multiple cyst aspirations as noted.  She has had a gallbladder surgery by Dr. Cathey Endow a number of years ago.  In addition, she has had multiple moles removed, one from her right leg about two years ago, one from her right buttock in 2006 by Dr. Yetta Barre.  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  Abdominal hysterectomy for what sounds like early uterine cancer with intact ovaries at age 42.  PAST SURGICAL HISTORY: Past Surgical History  Procedure Laterality Date  . Cholecystectomy    .  Breast lumpectomy      FAMILY HISTORY Family History  Problem Relation Age of Onset  . Hypertension Mother   . Cancer Father    Patient's father had colon cancer, died in 75.  She has two brothers in good health.  Mother, is in good health as well.  GYNECOLOGIC HISTORY: She is G1, P55, menarche age 57.  She has been on hormone replacement therapy and was using patches.     SOCIAL HISTORY: Patient is married to her current husband for the past 31 years.  This is her second marriage.  Her daughter is from her first marriage, which lasted a year-and-a-half.  She is a grandmother.  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  Her husband is a retired Emergency planning/management officer.   ADVANCED DIRECTIVES:  Not on file.  HEALTH MAINTENANCE: History  Substance Use Topics  . Smoking status: Never Smoker   . Smokeless tobacco: Never Used  . Alcohol Use: Yes     Comment: Occasionally    No Known Allergies  Current Outpatient Prescriptions  Medication Sig Dispense Refill  . anastrozole (ARIMIDEX) 1 MG tablet TAKE 1 TABLET DAILY  90 tablet  3  . Bioflavonoid Products (PERIDRIN-C) 200-50-150 MG TABS Take as directed  100 each  4  . buPROPion (WELLBUTRIN XL) 150 MG 24 hr tablet Take 1 tablet (150 mg total) by mouth daily.  90 tablet  4  . gabapentin (NEURONTIN) 300 MG capsule Take 1 capsule (300 mg total) by mouth at bedtime.  90 capsule  4   No current facility-administered medications for this visit.    OBJECTIVE: Filed Vitals:   03/06/13 0920  BP: 117/71  Pulse: 76  Temp: 98.2 F (36.8 C)  Resp: 18     Body mass index is 25.76 kg/(m^2).     ECOG FS:  Grade 0 - Fully active  General appearance: Alert, cooperative, well nourished, no apparent distress Head: Normocephalic, without obvious abnormality, atraumatic Eyes: Conjunctivae/corneas clear, PERRLA, EOMI Nose: Nares, septum and mucosa are normal, no drainage or sinus tenderness. Neck: No adenopathy, supple, symmetrical, trachea midline, thyroid not enlarged, no tenderness Resp: Clear to auscultation bilaterally Cardio: Regular rate and rhythm, S1, S2 normal, no murmur, click, rub or gallop Breasts:  Soft bilaterally, well-healed right breast lumpectomy scar, no lymphadenopathy, no nipple inversion, no axilla fullness GI: Soft, distended, non-tender, hypoactive bowel sounds,  no organomegaly Extremities: Extremities normal, atraumatic, no cyanosis or edema Lymph nodes: Cervical, supraclavicular, and axillary nodes normal Neurologic: Grossly normal    LAB RESULTS: Lab Results  Component Value Date   WBC 4.0 01/22/2013   NEUTROABS 2.3 01/22/2013   HGB 11.9 01/22/2013   HCT 35.3 01/22/2013   MCV 87.0 01/22/2013   PLT 224 01/22/2013      Chemistry      Component Value Date/Time   NA 142 01/22/2013 0802   NA 136 08/19/2011 1118   K 4.0 01/22/2013 0802   K 4.4 08/19/2011 1118   CL 103 08/19/2011 1118   CO2 26 01/22/2013 0802   CO2 27 08/19/2011 1118   BUN 11.6 01/22/2013 0802   BUN 12 08/19/2011 1118   CREATININE 0.9 01/22/2013 0802   CREATININE 0.82 08/19/2011 1118      Component Value Date/Time   CALCIUM 9.0 01/22/2013 0802   CALCIUM 9.4 08/19/2011 1118   ALKPHOS 90 01/22/2013 0802   ALKPHOS 96 08/19/2011 1118   AST 14 01/22/2013 0802   AST 15 08/19/2011  1118   ALT 18 01/22/2013 0802   ALT 21 08/19/2011 1118   BILITOT 0.37 01/22/2013 0802   BILITOT 0.6 08/19/2011 1118      Lab Results  Component Value Date   LABCA2 19 01/22/2013    Urinalysis No results found for this basename: colorurine,  appearanceur,  labspec,  phurine,  glucoseu,  hgbur,  bilirubinur,  ketonesur,  proteinur,  urobilinogen,  nitrite,  leukocytesur    STUDIES: No results found.  ASSESSMENT: 57 y.o. 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 Arimidex September 2009 to October 2014  (5) history of remote endometrial cancer, status post hysterectomy with no salpingo-oophorectomy    PLAN: Carlisle Beers has completed her adjuvant therapy and is ready to "graduate" from followup here. She is very comfortable with that  decision. However there were a couple of issues to be discussed. One was about whether she should have genetic testing given her earlier history of endometrial cancer. We will operationalized that.  The other issue relates to vaginal atrophy. Carlisle Beers has tried a variety of approaches, none of which have really helped. The only thing that will help is local estrogen preparations such as Vagifem or Estring. She is aware that hormone replacement with estrogen and progesterone does increase the risk of breast cancer, but we do not have similar data for estrogen alone--in women without a history of breast cancer estrogen replacement does not seem to increase the risk of breast cancer developing.  Unfortunately we do not have similar data for women with a history of breast cancer. Extrapolating from the data we do have, however, I am not uncomfortable with your using local estrogen preparations for her vaginal atrophy given her understanding of the lack of data as just discussed, and the fact that this is a significant quality of life issue for her. Accordingly she will discuss this further with her gynecologist.   Carlisle Beers understands she needs yearly mammography and yearly physician breast exam. I will be glad to see her at any point in the future if and when the need arises, but as of now no further routine appointments are being made for her here.  Lowella Dell  MD  03/06/2013

## 2013-03-07 ENCOUNTER — Telehealth: Payer: Self-pay | Admitting: *Deleted

## 2013-03-07 NOTE — Telephone Encounter (Signed)
Left message w/ Kathlene November to have pt to return my call so I can schedule a genetic appt.

## 2013-03-14 ENCOUNTER — Telehealth: Payer: Self-pay | Admitting: *Deleted

## 2013-03-14 NOTE — Telephone Encounter (Signed)
Confirmed pt to see genetics on 03/26/13.

## 2013-03-26 ENCOUNTER — Other Ambulatory Visit: Payer: BC Managed Care – PPO | Admitting: Lab

## 2013-03-26 ENCOUNTER — Encounter: Payer: BC Managed Care – PPO | Admitting: Genetic Counselor

## 2013-06-14 ENCOUNTER — Telehealth: Payer: Self-pay | Admitting: *Deleted

## 2013-06-14 NOTE — Telephone Encounter (Signed)
Saw that pt has not called back to get rescheduled for her genetic appt.  Called and left a message for pt to return my call so I can see if she wants to get scheduled.

## 2013-06-27 ENCOUNTER — Telehealth: Payer: Self-pay | Admitting: *Deleted

## 2013-06-27 NOTE — Telephone Encounter (Signed)
Confirmed 09/06/13 genetic appt w/ pt.  Mailed calendar to pt.

## 2013-07-31 ENCOUNTER — Other Ambulatory Visit: Payer: Self-pay | Admitting: Obstetrics and Gynecology

## 2013-07-31 DIAGNOSIS — Z853 Personal history of malignant neoplasm of breast: Secondary | ICD-10-CM

## 2013-07-31 DIAGNOSIS — Z9889 Other specified postprocedural states: Secondary | ICD-10-CM

## 2013-08-06 ENCOUNTER — Other Ambulatory Visit: Payer: Self-pay | Admitting: Dermatology

## 2013-08-08 ENCOUNTER — Telehealth: Payer: Self-pay | Admitting: *Deleted

## 2013-08-08 NOTE — Telephone Encounter (Signed)
Called pt to make her aware that [facility] Glad will no longer be here and we needed her to come in at a different time.  Confirmed 4/30 at 12:30 appt w/ pt.

## 2013-09-04 ENCOUNTER — Other Ambulatory Visit: Payer: Self-pay | Admitting: Oncology

## 2013-09-06 ENCOUNTER — Other Ambulatory Visit: Payer: BC Managed Care – PPO

## 2013-10-23 ENCOUNTER — Ambulatory Visit
Admission: RE | Admit: 2013-10-23 | Discharge: 2013-10-23 | Disposition: A | Discharge status: Home / Self Care | Payer: BC Managed Care – PPO | Source: Ambulatory Visit | Attending: Obstetrics and Gynecology | Admitting: Obstetrics and Gynecology

## 2013-10-23 DIAGNOSIS — Z9889 Other specified postprocedural states: Secondary | ICD-10-CM

## 2013-10-23 DIAGNOSIS — Z853 Personal history of malignant neoplasm of breast: Secondary | ICD-10-CM

## 2013-10-30 ENCOUNTER — Other Ambulatory Visit: Payer: Self-pay | Admitting: Obstetrics and Gynecology

## 2013-10-30 DIAGNOSIS — R922 Inconclusive mammogram: Secondary | ICD-10-CM

## 2013-10-30 DIAGNOSIS — Z853 Personal history of malignant neoplasm of breast: Secondary | ICD-10-CM

## 2013-11-08 ENCOUNTER — Other Ambulatory Visit: Payer: BC Managed Care – PPO

## 2013-11-13 ENCOUNTER — Other Ambulatory Visit: Payer: Self-pay | Admitting: Oncology

## 2013-11-13 NOTE — Telephone Encounter (Signed)
Sent message to Dr. Josetta Huddle to request he take over bupropion 150 mg script since patient is no longer being followed at [facility]. Also sent message to pharmacy.

## 2013-11-27 ENCOUNTER — Other Ambulatory Visit: Payer: Self-pay | Admitting: *Deleted

## 2013-11-27 DIAGNOSIS — C50919 Malignant neoplasm of unspecified site of unspecified female breast: Secondary | ICD-10-CM

## 2013-11-27 MED ORDER — BUPROPION HCL ER (XL) 150 MG PO TB24: 150.0000 mg | ORAL_TABLET | Freq: Every day | ORAL | Status: DC

## 2013-11-27 NOTE — Telephone Encounter (Signed)
Pt called to this RN to state refill for wellbutrin was declined by this office. Pt states she will be out of medication " and I didn't think this was a medicine you could just stop taking ". Dr Jannifer Rodney is prescribing MD for this medication.  Per chart review noted prescription declined on 11/13/2013 with request for refill to be sent to primary MD due to pt has been released from care per this office.  Janye states she did not realize she would not be able to get refills - she has not established herself with a primary MD " but I see my GYN and maybe she would refill it?"  Plan is wellbutrin refill will be given x 1 and pt will establish refills either by obtaining a primary MD or have GYN refill.

## 2014-01-25 ENCOUNTER — Ambulatory Visit
Admission: RE | Admit: 2014-01-25 | Discharge: 2014-01-25 | Disposition: A | Discharge status: Home / Self Care | Payer: BC Managed Care – PPO | Source: Ambulatory Visit | Attending: Obstetrics and Gynecology | Admitting: Obstetrics and Gynecology

## 2014-01-25 DIAGNOSIS — Z853 Personal history of malignant neoplasm of breast: Secondary | ICD-10-CM

## 2014-01-25 DIAGNOSIS — R922 Inconclusive mammogram: Secondary | ICD-10-CM

## 2014-01-25 MED ORDER — GADOBENATE DIMEGLUMINE 529 MG/ML IV SOLN
15.0000 mL | Freq: Once | INTRAVENOUS | Status: AC | PRN
  Administered 2014-01-25: 15 mL via INTRAVENOUS

## 2014-08-05 ENCOUNTER — Other Ambulatory Visit: Payer: Self-pay | Admitting: Obstetrics and Gynecology

## 2014-08-05 DIAGNOSIS — Z9889 Other specified postprocedural states: Secondary | ICD-10-CM

## 2014-08-05 DIAGNOSIS — Z853 Personal history of malignant neoplasm of breast: Secondary | ICD-10-CM

## 2014-11-07 ENCOUNTER — Other Ambulatory Visit: Payer: Self-pay | Admitting: Obstetrics and Gynecology

## 2014-11-07 ENCOUNTER — Ambulatory Visit
Admission: RE | Admit: 2014-11-07 | Discharge: 2014-11-07 | Disposition: A | Discharge status: Home / Self Care | Payer: BLUE CROSS/BLUE SHIELD | Source: Ambulatory Visit | Attending: Obstetrics and Gynecology | Admitting: Obstetrics and Gynecology

## 2014-11-07 DIAGNOSIS — Z853 Personal history of malignant neoplasm of breast: Secondary | ICD-10-CM

## 2014-11-07 DIAGNOSIS — Z9889 Other specified postprocedural states: Secondary | ICD-10-CM

## 2014-11-08 LAB — CYTOLOGY - PAP

## 2015-05-13 ENCOUNTER — Encounter (HOSPITAL_COMMUNITY): Payer: Self-pay | Admitting: Emergency Medicine

## 2015-05-13 ENCOUNTER — Emergency Department (HOSPITAL_COMMUNITY): Payer: BLUE CROSS/BLUE SHIELD

## 2015-05-13 ENCOUNTER — Emergency Department (HOSPITAL_COMMUNITY)
Admission: EM | Admit: 2015-05-13 | Discharge: 2015-05-13 | Disposition: A | Discharge status: Home / Self Care | Payer: BLUE CROSS/BLUE SHIELD | Attending: Emergency Medicine | Admitting: Emergency Medicine

## 2015-05-13 DIAGNOSIS — Z853 Personal history of malignant neoplasm of breast: Secondary | ICD-10-CM | POA: Insufficient documentation

## 2015-05-13 DIAGNOSIS — Z880 Allergy status to penicillin: Secondary | ICD-10-CM | POA: Diagnosis not present

## 2015-05-13 DIAGNOSIS — R110 Nausea: Secondary | ICD-10-CM | POA: Insufficient documentation

## 2015-05-13 DIAGNOSIS — R0981 Nasal congestion: Secondary | ICD-10-CM | POA: Diagnosis not present

## 2015-05-13 DIAGNOSIS — R05 Cough: Secondary | ICD-10-CM | POA: Diagnosis not present

## 2015-05-13 DIAGNOSIS — R197 Diarrhea, unspecified: Secondary | ICD-10-CM | POA: Insufficient documentation

## 2015-05-13 DIAGNOSIS — Z8542 Personal history of malignant neoplasm of other parts of uterus: Secondary | ICD-10-CM | POA: Diagnosis not present

## 2015-05-13 DIAGNOSIS — R079 Chest pain, unspecified: Secondary | ICD-10-CM | POA: Diagnosis present

## 2015-05-13 DIAGNOSIS — Z79899 Other long term (current) drug therapy: Secondary | ICD-10-CM | POA: Insufficient documentation

## 2015-05-13 DIAGNOSIS — R0789 Other chest pain: Secondary | ICD-10-CM | POA: Diagnosis not present

## 2015-05-13 LAB — BASIC METABOLIC PANEL
ANION GAP: 10 (ref 5–15)
BUN: 5 mg/dL — AB (ref 6–20)
CALCIUM: 8.6 mg/dL — AB (ref 8.9–10.3)
CO2: 25 mmol/L (ref 22–32)
Chloride: 106 mmol/L (ref 101–111)
Creatinine, Ser: 0.76 mg/dL (ref 0.44–1.00)
GFR calc Af Amer: >60 mL/min (ref >60)
GFR calc non Af Amer: >60 mL/min (ref >60)
GLUCOSE: 91 mg/dL (ref 65–99)
Potassium: 3.4 mmol/L — ABNORMAL LOW (ref 3.5–5.1)
Sodium: 141 mmol/L (ref 135–145)

## 2015-05-13 LAB — I-STAT TROPONIN, ED
Troponin i, poc: 0 ng/mL (ref 0.00–0.08)
Troponin i, poc: 0.01 ng/mL (ref 0.00–0.08)

## 2015-05-13 LAB — CBC
HCT: 37.2 % (ref 36.0–46.0)
HEMOGLOBIN: 11.8 g/dL — AB (ref 12.0–15.0)
MCH: 28.2 pg (ref 26.0–34.0)
MCHC: 31.7 g/dL (ref 30.0–36.0)
MCV: 88.8 fL (ref 78.0–100.0)
Platelets: 181 10*3/uL (ref 150–400)
RBC: 4.19 MIL/uL (ref 3.87–5.11)
RDW: 13.5 % (ref 11.5–15.5)
WBC: 3.1 10*3/uL — ABNORMAL LOW (ref 4.0–10.5)

## 2015-05-13 MED ORDER — ONDANSETRON 8 MG PO TBDP: 8.0000 mg | ORAL_TABLET | Freq: Three times a day (TID) | ORAL | Status: DC | PRN

## 2015-05-13 NOTE — ED Notes (Signed)
Pt sts episodes of nausea and lightheadedness with some left sided CP starting last night; pt had EKG in office and sent here for further details; pt sts some diarrhea as well

## 2015-05-13 NOTE — ED Provider Notes (Signed)
CSN: [id number]     Arrival date & time 05/13/15  1640 History   First MD Initiated Contact with Patient 05/13/15 1820     Chief Complaint  Patient presents with  . Chest Pain  . Nausea     (Consider location/radiation/quality/duration/timing/severity/associated sxs/prior Treatment) Patient is a 60 y.o. female presenting with chest pain. The history is provided by the patient.  Chest Pain Pain location:  L chest Associated symptoms: cough and nausea   Associated symptoms: no abdominal pain, no back pain, no headache, no numbness, no shortness of breath, not vomiting and no weakness    patient has had some pain in her left chest starting last night. She's had URI symptoms for the last 3 days. She's had an occasional cough and nasal congestion. Also had a little bit of nausea and diarrhea. She's  Felt a little bad all over. Last night she had left-sided chest pain. Would come and go. He would go to the left arm. States she went to her primary care doctor today had an EKG done and told it was abnormal. Sent to come to the ER. EKG here is nonspecific ST and T-wave changes, they're stable from previous EKG. No fevers. No rash. Previous history of shingles and chickenpox. States it goes along her left chest along her left breast.  Past Medical History  Diagnosis Date  . Breast cancer ([place]) 2009  . Uterine cancer ([place])     At age 34   Past Surgical History  Procedure Laterality Date  . Cholecystectomy    . Breast lumpectomy     Family History  Problem Relation Age of Onset  . Hypertension Mother   . Cancer Father    Social History  Substance Use Topics  . Smoking status: Never Smoker   . Smokeless tobacco: Never Used  . Alcohol Use: Yes     Comment: Occasionally   OB History    No data available     Review of Systems  Constitutional: Negative for activity change and appetite change.  HENT: Positive for congestion.   Eyes: Negative for pain.  Respiratory: Positive for cough.  Negative for chest tightness and shortness of breath.   Cardiovascular: Positive for chest pain. Negative for leg swelling.  Gastrointestinal: Positive for nausea and diarrhea. Negative for vomiting and abdominal pain.  Genitourinary: Negative for flank pain.  Musculoskeletal: Negative for back pain and neck stiffness.  Skin: Negative for rash.  Neurological: Negative for weakness, numbness and headaches.  Psychiatric/Behavioral: Negative for behavioral problems.      Allergies  Influenza vaccines and Penicillins  Home Medications   Prior to Admission medications   Medication Sig Start Date End Date Taking? Authorizing Provider  Bioflavonoid Products (PERIDRIN-C) [id number] MG TABS Take as directed Patient taking differently: Take 2 tablets by mouth at bedtime. Take as directed 11/28/12  Yes Chauncey Cruel, MD  buPROPion (WELLBUTRIN XL) 150 MG 24 hr tablet Take 150 mg by mouth at bedtime. 03/26/15  Yes Historical Provider, MD  Vitamin D, Ergocalciferol, (DRISDOL) 50000 units CAPS capsule Take 50,000 Units by mouth 2 (two) times a week.   Yes Historical Provider, MD  zolpidem (AMBIEN) 10 MG tablet Take 5 mg by mouth at bedtime as needed for sleep.   Yes Historical Provider, MD  anastrozole (ARIMIDEX) 1 MG tablet TAKE 1 TABLET DAILY Patient not taking: Reported on 05/13/2015 06/04/12   Chauncey Cruel, MD  buPROPion (WELLBUTRIN XL) 150 MG 24 hr tablet Take 1 tablet (150  mg total) by mouth daily. 11/27/13 11/27/14  Chauncey Cruel, MD  gabapentin (NEURONTIN) 300 MG capsule Take 1 capsule (300 mg total) by mouth at bedtime. Patient not taking: Reported on 05/13/2015 06/05/12   [name], NP  ondansetron (ZOFRAN-ODT) 8 MG disintegrating tablet Take 1 tablet (8 mg total) by mouth every 8 (eight) hours as needed for nausea or vomiting. 05/13/15   Davonna Belling, MD   BP 120/67 mmHg  Pulse 69  Temp(Src) 98.6 F (37 C) (Oral)  Resp 17  SpO2 97% Physical Exam  Constitutional: She  is oriented to person, place, and time. She appears well-developed and well-nourished.  HENT:  Head: Normocephalic and atraumatic.  Eyes: EOM are normal. Pupils are equal, round, and reactive to light.  Neck: Normal range of motion. Neck supple.  Cardiovascular: Normal rate, regular rhythm and normal heart sounds.   No murmur heard. Pulmonary/Chest: Effort normal and breath sounds normal. No respiratory distress. She has no wheezes. She has no rales. She exhibits tenderness.   Mild tenderness left anterior chest wall. No rash.  Abdominal: Soft. Bowel sounds are normal. She exhibits no distension. There is no tenderness.  Musculoskeletal: Normal range of motion.  Neurological: She is alert and oriented to person, place, and time. No cranial nerve deficit.  Skin: Skin is warm and dry.  Psychiatric: She has a normal mood and affect. Her speech is normal.  Nursing note and vitals reviewed.   ED Course  Procedures (including critical care time) Labs Review Labs Reviewed  BASIC METABOLIC PANEL - Abnormal; Notable for the following:    Potassium 3.4 (*)    BUN 5 (*)    Calcium 8.6 (*)    All other components within normal limits  CBC - Abnormal; Notable for the following:    WBC 3.1 (*)    Hemoglobin 11.8 (*)    All other components within normal limits  I-STAT TROPOININ, ED  I-STAT TROPOININ, ED    Imaging Review Dg Chest 2 View  05/13/2015  CLINICAL DATA:  Left arm numbness starting last night EXAM: CHEST  2 VIEW COMPARISON:  03/08/2005 FINDINGS: The heart size and mediastinal contours are within normal limits. Both lungs are clear. The visualized skeletal structures are unremarkable. IMPRESSION: No active cardiopulmonary disease. Electronically Signed   By: Lahoma Crocker M.D.   On: 05/13/2015 17:35   I have personally reviewed and evaluated these images and lab results as part of my medical decision-making.   EKG Interpretation   Date/Time:  Tuesday May 13 2015 16:50:43  EST Ventricular Rate:  71 PR Interval:  122 QRS Duration: 72 QT Interval:  410 QTC Calculation: 445 R Axis:   51 Text Interpretation:  Normal sinus rhythm Nonspecific ST abnormality  Abnormal ECG No significant change since last tracing Confirmed by  Alvino Chapel  MD, Ovid Curd [phone]) on 05/13/2015 6:20:28 PM      MDM   Final diagnoses:  Chest pain, unspecified chest pain type  Nausea     patient with left-sided chest pain. Not exertional. Sent in from urgent care since EKG was abnormal. Stable from previous EKG on my examination. Will discharge home. Also has had nausea. Has felt somewhat better.    Davonna Belling, MD 05/13/15 2328

## 2015-05-13 NOTE — Discharge Instructions (Signed)
Nonspecific Chest Pain  °Chest pain can be caused by many different conditions. There is always a chance that your pain could be related to something serious, such as a heart attack or a blood clot in your lungs. Chest pain can also be caused by conditions that are not life-threatening. If you have chest pain, it is very important to follow up with your health care provider. °CAUSES  °Chest pain can be caused by: °· Heartburn. °· Pneumonia or bronchitis. °· Anxiety or stress. °· Inflammation around your heart (pericarditis) or lung (pleuritis or pleurisy). °· A blood clot in your lung. °· A collapsed lung (pneumothorax). It can develop suddenly on its own (spontaneous pneumothorax) or from trauma to the chest. °· Shingles infection (varicella-zoster virus). °· Heart attack. °· Damage to the bones, muscles, and cartilage that make up your chest wall. This can include: °¨ Bruised bones due to injury. °¨ Strained muscles or cartilage due to frequent or repeated coughing or overwork. °¨ Fracture to one or more ribs. °¨ Sore cartilage due to inflammation (costochondritis). °RISK FACTORS  °Risk factors for chest pain may include: °· Activities that increase your risk for trauma or injury to your chest. °· Respiratory infections or conditions that cause frequent coughing. °· Medical conditions or overeating that can cause heartburn. °· Heart disease or family history of heart disease. °· Conditions or health behaviors that increase your risk of developing a blood clot. °· Having had chicken pox (varicella zoster). °SIGNS AND SYMPTOMS °Chest pain can feel like: °· Burning or tingling on the surface of your chest or deep in your chest. °· Crushing, pressure, aching, or squeezing pain. °· Dull or sharp pain that is worse when you move, cough, or take a deep breath. °· Pain that is also felt in your back, neck, shoulder, or arm, or pain that spreads to any of these areas. °Your chest pain may come and go, or it may stay  constant. °DIAGNOSIS °Lab tests or other studies may be needed to find the cause of your pain. Your health care provider may have you take a test called an ambulatory ECG (electrocardiogram). An ECG records your heartbeat patterns at the time the test is performed. You may also have other tests, such as: °· Transthoracic echocardiogram (TTE). During echocardiography, sound waves are used to create a picture of all of the heart structures and to look at how blood flows through your heart. °· Transesophageal echocardiogram (TEE). This is a more advanced imaging test that obtains images from inside your body. It allows your health care provider to see your heart in finer detail. °· Cardiac monitoring. This allows your health care provider to monitor your heart rate and rhythm in real time. °· Holter monitor. This is a portable device that records your heartbeat and can help to diagnose abnormal heartbeats. It allows your health care provider to track your heart activity for several days, if needed. °· Stress tests. These can be done through exercise or by taking medicine that makes your heart beat more quickly. °· Blood tests. °· Imaging tests. °TREATMENT  °Your treatment depends on what is causing your chest pain. Treatment may include: °· Medicines. These may include: °¨ Acid blockers for heartburn. °¨ Anti-inflammatory medicine. °¨ Pain medicine for inflammatory conditions. °¨ Antibiotic medicine, if an infection is present. °¨ Medicines to dissolve blood clots. °¨ Medicines to treat coronary artery disease. °· Supportive care for conditions that do not require medicines. This may include: °¨ Resting. °¨ Applying heat   or cold packs to injured areas. °¨ Limiting activities until pain decreases. °HOME CARE INSTRUCTIONS °· If you were prescribed an antibiotic medicine, finish it all even if you start to feel better. °· Avoid any activities that bring on chest pain. °· Do not use any tobacco products, including  cigarettes, chewing tobacco, or electronic cigarettes. If you need help quitting, ask your health care provider. °· Do not drink alcohol. °· Take medicines only as directed by your health care provider. °· Keep all follow-up visits as directed by your health care provider. This is important. This includes any further testing if your chest pain does not go away. °· If heartburn is the cause for your chest pain, you may be told to keep your head raised (elevated) while sleeping. This reduces the chance that acid will go from your stomach into your esophagus. °· Make lifestyle changes as directed by your health care provider. These may include: °¨ Getting regular exercise. Ask your health care provider to suggest some activities that are safe for you. °¨ Eating a heart-healthy diet. A registered dietitian can help you to learn healthy eating options. °¨ Maintaining a healthy weight. °¨ Managing diabetes, if necessary. °¨ Reducing stress. °SEEK MEDICAL CARE IF: °· Your chest pain does not go away after treatment. °· You have a rash with blisters on your chest. °· You have a fever. °SEEK IMMEDIATE MEDICAL CARE IF:  °· Your chest pain is worse. °· You have an increasing cough, or you cough up blood. °· You have severe abdominal pain. °· You have severe weakness. °· You faint. °· You have chills. °· You have sudden, unexplained chest discomfort. °· You have sudden, unexplained discomfort in your arms, back, neck, or jaw. °· You have shortness of breath at any time. °· You suddenly start to sweat, or your skin gets clammy. °· You feel nauseous or you vomit. °· You suddenly feel light-headed or dizzy. °· Your heart begins to beat quickly, or it feels like it is skipping beats. °These symptoms may represent a serious problem that is an emergency. Do not wait to see if the symptoms will go away. Get medical help right away. Call your local emergency services (911 in the [place] Do not drive yourself to the hospital. °  °This  information is not intended to replace advice given to you by your health care provider. Make sure you discuss any questions you have with your health care provider. °  °Document Released: 02/03/2005 Document Revised: 05/17/2014 Document Reviewed: 11/30/2013 °Elsevier Interactive Patient Education ©2016 [employer] ° °

## 2015-09-15 ENCOUNTER — Other Ambulatory Visit: Payer: Self-pay

## 2015-09-15 DIAGNOSIS — Z853 Personal history of malignant neoplasm of breast: Secondary | ICD-10-CM

## 2015-09-15 DIAGNOSIS — Z1231 Encounter for screening mammogram for malignant neoplasm of breast: Secondary | ICD-10-CM

## 2015-11-18 ENCOUNTER — Ambulatory Visit
Admission: RE | Admit: 2015-11-18 | Discharge: 2015-11-18 | Disposition: A | Discharge status: Home / Self Care | Payer: BLUE CROSS/BLUE SHIELD | Source: Ambulatory Visit

## 2015-11-18 DIAGNOSIS — Z1231 Encounter for screening mammogram for malignant neoplasm of breast: Secondary | ICD-10-CM

## 2015-11-18 DIAGNOSIS — Z853 Personal history of malignant neoplasm of breast: Secondary | ICD-10-CM

## 2015-11-20 ENCOUNTER — Other Ambulatory Visit: Payer: Self-pay | Admitting: Internal Medicine

## 2015-11-20 DIAGNOSIS — R928 Other abnormal and inconclusive findings on diagnostic imaging of breast: Secondary | ICD-10-CM

## 2015-11-25 ENCOUNTER — Ambulatory Visit
Admission: RE | Admit: 2015-11-25 | Discharge: 2015-11-25 | Disposition: A | Discharge status: Home / Self Care | Payer: BLUE CROSS/BLUE SHIELD | Source: Ambulatory Visit | Attending: Internal Medicine | Admitting: Internal Medicine

## 2015-11-25 DIAGNOSIS — R928 Other abnormal and inconclusive findings on diagnostic imaging of breast: Secondary | ICD-10-CM

## 2015-11-26 ENCOUNTER — Other Ambulatory Visit: Payer: BLUE CROSS/BLUE SHIELD

## 2016-08-23 ENCOUNTER — Other Ambulatory Visit: Payer: Self-pay | Admitting: Obstetrics and Gynecology

## 2016-08-23 DIAGNOSIS — Z1231 Encounter for screening mammogram for malignant neoplasm of breast: Secondary | ICD-10-CM

## 2016-11-26 ENCOUNTER — Ambulatory Visit
Admission: RE | Admit: 2016-11-26 | Discharge: 2016-11-26 | Disposition: A | Discharge status: Home / Self Care | Payer: BLUE CROSS/BLUE SHIELD | Source: Ambulatory Visit | Attending: Obstetrics and Gynecology | Admitting: Obstetrics and Gynecology

## 2016-11-26 DIAGNOSIS — Z1231 Encounter for screening mammogram for malignant neoplasm of breast: Secondary | ICD-10-CM

## 2016-11-26 HISTORY — DX: Personal history of irradiation: Z92.3

## 2017-08-29 ENCOUNTER — Other Ambulatory Visit: Payer: Self-pay | Admitting: Obstetrics and Gynecology

## 2017-08-29 DIAGNOSIS — Z1231 Encounter for screening mammogram for malignant neoplasm of breast: Secondary | ICD-10-CM

## 2017-11-29 ENCOUNTER — Ambulatory Visit
Admission: RE | Admit: 2017-11-29 | Discharge: 2017-11-29 | Disposition: A | Discharge status: Home / Self Care | Payer: 59 | Source: Ambulatory Visit | Attending: Obstetrics and Gynecology | Admitting: Obstetrics and Gynecology

## 2017-11-29 DIAGNOSIS — Z01419 Encounter for gynecological examination (general) (routine) without abnormal findings: Secondary | ICD-10-CM | POA: Diagnosis not present

## 2017-11-29 DIAGNOSIS — Z1231 Encounter for screening mammogram for malignant neoplasm of breast: Secondary | ICD-10-CM | POA: Diagnosis not present

## 2017-11-29 DIAGNOSIS — Z6828 Body mass index (BMI) 28.0-28.9, adult: Secondary | ICD-10-CM | POA: Diagnosis not present

## 2017-11-29 DIAGNOSIS — Z853 Personal history of malignant neoplasm of breast: Secondary | ICD-10-CM | POA: Diagnosis not present

## 2017-11-29 DIAGNOSIS — Z8542 Personal history of malignant neoplasm of other parts of uterus: Secondary | ICD-10-CM | POA: Diagnosis not present

## 2017-11-29 DIAGNOSIS — Z85820 Personal history of malignant melanoma of skin: Secondary | ICD-10-CM | POA: Diagnosis not present

## 2018-01-05 DIAGNOSIS — L03011 Cellulitis of right finger: Secondary | ICD-10-CM | POA: Diagnosis not present

## 2018-01-18 DIAGNOSIS — L980 Pyogenic granuloma: Secondary | ICD-10-CM | POA: Diagnosis not present

## 2018-01-20 DIAGNOSIS — K635 Polyp of colon: Secondary | ICD-10-CM | POA: Diagnosis not present

## 2018-01-20 DIAGNOSIS — D122 Benign neoplasm of ascending colon: Secondary | ICD-10-CM | POA: Diagnosis not present

## 2018-01-20 DIAGNOSIS — Z86010 Personal history of colonic polyps: Secondary | ICD-10-CM | POA: Diagnosis not present

## 2018-02-15 DIAGNOSIS — Z23 Encounter for immunization: Secondary | ICD-10-CM | POA: Diagnosis not present

## 2018-02-22 DIAGNOSIS — D485 Neoplasm of uncertain behavior of skin: Secondary | ICD-10-CM | POA: Diagnosis not present

## 2018-02-22 DIAGNOSIS — Z85820 Personal history of malignant melanoma of skin: Secondary | ICD-10-CM | POA: Diagnosis not present

## 2018-02-22 DIAGNOSIS — Z85828 Personal history of other malignant neoplasm of skin: Secondary | ICD-10-CM | POA: Diagnosis not present

## 2018-03-11 DIAGNOSIS — J00 Acute nasopharyngitis [common cold]: Secondary | ICD-10-CM | POA: Diagnosis not present

## 2018-03-11 DIAGNOSIS — J018 Other acute sinusitis: Secondary | ICD-10-CM | POA: Diagnosis not present

## 2018-03-15 DIAGNOSIS — J0100 Acute maxillary sinusitis, unspecified: Secondary | ICD-10-CM | POA: Diagnosis not present

## 2018-03-15 DIAGNOSIS — J209 Acute bronchitis, unspecified: Secondary | ICD-10-CM | POA: Diagnosis not present

## 2018-04-17 DIAGNOSIS — L980 Pyogenic granuloma: Secondary | ICD-10-CM | POA: Diagnosis not present

## 2018-04-17 DIAGNOSIS — S60949A Unspecified superficial injury of unspecified finger, initial encounter: Secondary | ICD-10-CM | POA: Diagnosis not present

## 2018-04-25 ENCOUNTER — Other Ambulatory Visit: Payer: Self-pay | Admitting: Orthopedic Surgery

## 2018-04-25 DIAGNOSIS — L988 Other specified disorders of the skin and subcutaneous tissue: Secondary | ICD-10-CM | POA: Diagnosis not present

## 2018-04-25 DIAGNOSIS — S60941D Unspecified superficial injury of left index finger, subsequent encounter: Secondary | ICD-10-CM | POA: Diagnosis not present

## 2018-05-10 DIAGNOSIS — U071 COVID-19: Secondary | ICD-10-CM

## 2018-05-10 HISTORY — DX: COVID-19: U07.1

## 2018-11-02 ENCOUNTER — Other Ambulatory Visit: Payer: Self-pay | Admitting: Obstetrics and Gynecology

## 2018-11-02 DIAGNOSIS — Z1231 Encounter for screening mammogram for malignant neoplasm of breast: Secondary | ICD-10-CM

## 2018-11-08 ENCOUNTER — Other Ambulatory Visit: Payer: Self-pay | Admitting: Radiology

## 2018-11-08 DIAGNOSIS — Z853 Personal history of malignant neoplasm of breast: Secondary | ICD-10-CM

## 2018-11-08 DIAGNOSIS — N6320 Unspecified lump in the left breast, unspecified quadrant: Secondary | ICD-10-CM

## 2018-11-09 ENCOUNTER — Other Ambulatory Visit: Payer: Self-pay

## 2018-11-09 ENCOUNTER — Ambulatory Visit
Admission: RE | Admit: 2018-11-09 | Discharge: 2018-11-09 | Disposition: A | Discharge status: Home / Self Care | Payer: 59 | Source: Ambulatory Visit | Attending: Radiology | Admitting: Radiology

## 2018-11-09 DIAGNOSIS — N6320 Unspecified lump in the left breast, unspecified quadrant: Secondary | ICD-10-CM

## 2018-11-09 DIAGNOSIS — Z853 Personal history of malignant neoplasm of breast: Secondary | ICD-10-CM

## 2018-11-21 ENCOUNTER — Other Ambulatory Visit: Payer: 59

## 2018-12-14 ENCOUNTER — Ambulatory Visit: Payer: 59

## 2019-01-18 ENCOUNTER — Other Ambulatory Visit: Payer: Self-pay

## 2019-01-18 ENCOUNTER — Ambulatory Visit
Admission: RE | Admit: 2019-01-18 | Discharge: 2019-01-18 | Disposition: A | Discharge status: Home / Self Care | Payer: 59 | Source: Ambulatory Visit | Attending: Obstetrics and Gynecology | Admitting: Obstetrics and Gynecology

## 2019-01-18 DIAGNOSIS — Z1231 Encounter for screening mammogram for malignant neoplasm of breast: Secondary | ICD-10-CM

## 2019-01-19 ENCOUNTER — Other Ambulatory Visit: Payer: Self-pay | Admitting: Obstetrics and Gynecology

## 2019-01-19 DIAGNOSIS — R928 Other abnormal and inconclusive findings on diagnostic imaging of breast: Secondary | ICD-10-CM

## 2019-01-22 ENCOUNTER — Ambulatory Visit
Admission: RE | Admit: 2019-01-22 | Discharge: 2019-01-22 | Disposition: A | Discharge status: Home / Self Care | Payer: 59 | Source: Ambulatory Visit | Attending: Obstetrics and Gynecology | Admitting: Obstetrics and Gynecology

## 2019-01-22 ENCOUNTER — Other Ambulatory Visit: Payer: Self-pay

## 2019-01-22 DIAGNOSIS — R928 Other abnormal and inconclusive findings on diagnostic imaging of breast: Secondary | ICD-10-CM

## 2019-02-22 ENCOUNTER — Other Ambulatory Visit: Payer: Self-pay | Admitting: Orthopedic Surgery

## 2019-08-20 ENCOUNTER — Ambulatory Visit: Payer: 59

## 2019-12-17 ENCOUNTER — Other Ambulatory Visit: Payer: Self-pay | Admitting: Obstetrics and Gynecology

## 2019-12-17 DIAGNOSIS — Z1231 Encounter for screening mammogram for malignant neoplasm of breast: Secondary | ICD-10-CM

## 2019-12-24 ENCOUNTER — Other Ambulatory Visit: Payer: Self-pay | Admitting: Internal Medicine

## 2019-12-24 DIAGNOSIS — R102 Pelvic and perineal pain: Secondary | ICD-10-CM

## 2019-12-31 ENCOUNTER — Ambulatory Visit
Admission: RE | Admit: 2019-12-31 | Discharge: 2019-12-31 | Disposition: A | Discharge status: Home / Self Care | Payer: 59 | Source: Ambulatory Visit | Attending: Internal Medicine | Admitting: Internal Medicine

## 2019-12-31 DIAGNOSIS — R102 Pelvic and perineal pain unspecified side: Secondary | ICD-10-CM

## 2020-01-17 ENCOUNTER — Other Ambulatory Visit: Payer: Self-pay | Admitting: Orthopedic Surgery

## 2020-01-21 ENCOUNTER — Other Ambulatory Visit: Payer: Self-pay

## 2020-01-21 ENCOUNTER — Ambulatory Visit
Admission: RE | Admit: 2020-01-21 | Discharge: 2020-01-21 | Disposition: A | Discharge status: Home / Self Care | Payer: 59 | Source: Ambulatory Visit | Attending: Obstetrics and Gynecology | Admitting: Obstetrics and Gynecology

## 2020-01-21 DIAGNOSIS — Z1231 Encounter for screening mammogram for malignant neoplasm of breast: Secondary | ICD-10-CM

## 2020-01-23 ENCOUNTER — Other Ambulatory Visit: Payer: Self-pay | Admitting: Obstetrics and Gynecology

## 2020-01-23 DIAGNOSIS — R928 Other abnormal and inconclusive findings on diagnostic imaging of breast: Secondary | ICD-10-CM

## 2020-02-04 ENCOUNTER — Other Ambulatory Visit: Payer: Self-pay

## 2020-02-04 ENCOUNTER — Ambulatory Visit
Admission: RE | Admit: 2020-02-04 | Discharge: 2020-02-04 | Disposition: A | Discharge status: Home / Self Care | Payer: 59 | Source: Ambulatory Visit | Attending: Obstetrics and Gynecology | Admitting: Obstetrics and Gynecology

## 2020-02-04 DIAGNOSIS — R928 Other abnormal and inconclusive findings on diagnostic imaging of breast: Secondary | ICD-10-CM

## 2020-08-05 ENCOUNTER — Emergency Department (INDEPENDENT_AMBULATORY_CARE_PROVIDER_SITE_OTHER)
Admission: RE | Admit: 2020-08-05 | Discharge: 2020-08-05 | Disposition: A | Discharge status: Home / Self Care | Payer: 59 | Source: Ambulatory Visit | Attending: Family Medicine | Admitting: Family Medicine

## 2020-08-05 ENCOUNTER — Other Ambulatory Visit: Payer: Self-pay

## 2020-08-05 VITALS — BP 149/84 | HR 81 | Temp 98.2°F | Resp 16 | Ht 67.0 in | Wt 157.0 lb

## 2020-08-05 DIAGNOSIS — J069 Acute upper respiratory infection, unspecified: Secondary | ICD-10-CM

## 2020-08-05 MED ORDER — DOXYCYCLINE HYCLATE 100 MG PO CAPS: ORAL_CAPSULE | ORAL | 0 refills | Status: DC

## 2020-08-05 NOTE — ED Triage Notes (Addendum)
Sinus pressure and headache since Sunday  Dayquil yesterday  Home COVID test negative this am  Left ear pain  COVID vaccine & booster

## 2020-08-05 NOTE — ED Provider Notes (Signed)
[name]    CSN: [id number] Arrival date & time: 08/05/20  1150      History   Chief Complaint Chief Complaint  Patient presents with  . Facial Pain  . Cough    HPI Ashley Norton is a 65 y.o. female.   Two days ago patient developed sinus congestion, fatigue, headache, and facial pressure.  Last night she had low grade fever 99.5.  Today she developed a cough, right epistaxis (now resolved), and left ear feels clogged.  She denies shortness of breath or pleuritic pain.  The history is provided by the patient.    Past Medical History:  Diagnosis Date  . Breast cancer [facility]) 2009   Right  . COVID 2020  . Personal history of radiation therapy 2009  . Uterine cancer [facility])    At age 40    Patient Active Problem List   Diagnosis Date Noted  . Breast cancer ([place]) 06/05/2012    Past Surgical History:  Procedure Laterality Date  . BREAST LUMPECTOMY Right 2009  . CHOLECYSTECTOMY      OB History   No obstetric history on file.      Home Medications    Prior to Admission medications   Medication Sig Start Date End Date Taking? Authorizing Provider  ALPRAZolam Duanne Moron) 0.25 MG tablet Take 1 tablet by mouth 2 (two) times daily as needed. 12/21/19  Yes [provider]  buPROPion (WELLBUTRIN XL) 150 MG 24 hr tablet Take 150 mg by mouth at bedtime. 03/26/15  Yes [provider]  doxycycline (VIBRAMYCIN) 100 MG capsule Take one cap PO Q12hr with food. 08/05/20  Yes Kandra Nicolas, MD  fluticasone (FLONASE) 50 MCG/ACT nasal spray Place into both nostrils daily.   Yes [provider]  pantoprazole (PROTONIX) 40 MG tablet 1 tablet 07/24/20  Yes [provider]  Vitamin D, Ergocalciferol, (DRISDOL) 50000 units CAPS capsule Take 50,000 Units by mouth 2 (two) times a week.   Yes [provider]  ascorbic acid (VITAMIN C) 1000 MG tablet Vitamin C 1,000 mg tablet    [provider]  buPROPion (WELLBUTRIN XL) 150  MG 24 hr tablet Take 1 tablet (150 mg total) by mouth daily. 11/27/13 11/27/14  Magrinat, Virgie Dad, MD  cetirizine (ZYRTEC ALLERGY) 10 MG tablet 1 tablet    [provider]  ondansetron (ZOFRAN-ODT) 8 MG disintegrating tablet Take 1 tablet (8 mg total) by mouth every 8 (eight) hours as needed for nausea or vomiting. 05/13/15   Davonna Belling, MD  zolpidem (AMBIEN) 10 MG tablet Take 5 mg by mouth at bedtime as needed for sleep.    [provider]  gabapentin (NEURONTIN) 300 MG capsule Take 1 capsule (300 mg total) by mouth at bedtime. Patient not taking: Reported on 05/13/2015 06/05/12 08/05/20  [name], NP    Family History Family History  Problem Relation Age of Onset  . Hypertension Mother   . Cancer Father     Social History Social History   Tobacco Use  . Smoking status: Never Smoker  . Smokeless tobacco: Never Used  Substance Use Topics  . Alcohol use: Yes    Comment: Occasionally  . Drug use: No     Allergies   Other, Influenza vaccines, and Penicillins   Review of Systems Review of Systems No sore throat + cough No pleuritic pain No wheezing + nasal congestion + post-nasal drainage + sinus pain/pressure No itchy/red eyes ? earache No hemoptysis No SOB +  fever, + chills No nausea No vomiting No abdominal pain No diarrhea No urinary symptoms No skin rash + fatigue No myalgias + headache Used OTC meds (Zyrtec and Flonase) without relief   Physical Exam Triage Vital Signs ED Triage Vitals  Enc Vitals Group     BP 08/05/20 1201 (!) 149/84     Pulse Rate 08/05/20 1201 81     Resp 08/05/20 1201 16     Temp 08/05/20 1201 98.2 F (36.8 C)     Temp Source 08/05/20 1201 Oral     SpO2 08/05/20 1201 98 %     Weight 08/05/20 1204 157 lb (71.2 kg)     Height 08/05/20 1204 5\' 7"  (1.702 m)     Head Circumference --      Peak Flow --      Pain Score --      Pain Loc --      Pain Edu? --      Excl. in [place]? --    No data  found.  Updated Vital Signs BP (!) 149/84 (BP Location: Right Arm)   Pulse 81   Temp 98.2 F (36.8 C) (Oral)   Resp 16   Ht 5\' 7"  (1.702 m)   Wt 71.2 kg   SpO2 98%   BMI 24.59 kg/m   Visual Acuity Right Eye Distance:   Left Eye Distance:   Bilateral Distance:    Right Eye Near:   Left Eye Near:    Bilateral Near:     Physical Exam Nursing notes and Vital Signs reviewed. Appearance:  Patient appears stated age, and in no acute distress Eyes:  Pupils are equal, round, and reactive to light and accomodation.  Extraocular movement is intact.  Conjunctivae are not inflamed  Ears:  Canals normal.  Tympanic membranes normal.  Nose:  Mildly congested turbinates.  No sinus tenderness. Pharynx:  Normal Neck:  Supple.  Mildly enlarged lateral nodes are present, tender to palpation on the left.   Lungs:  Clear to auscultation.  Breath sounds are equal.  Moving air well. Heart:  Regular rate and rhythm without murmurs, rubs, or gallops. Chest:  Distinct tenderness to palpation over the mid-sternum.   Abdomen:  Nontender without masses or hepatosplenomegaly.  Bowel sounds are present.  No CVA or flank tenderness.  Extremities:  No edema.  Skin:  No rash present.   UC Treatments / Results  Labs (all labs ordered are listed, but only abnormal results are displayed) Labs Reviewed -   Tympanometry:  Right ear tympanogram normal; Left ear tympanogram normal  EKG   Radiology No results found.  Procedures Procedures (including critical care time)  Medications Ordered in UC Medications - No data to display  Initial Impression / Assessment and Plan / UC Course  I have reviewed the triage vital signs and the nursing notes.  Pertinent labs & imaging results that were available during my care of the patient were reviewed by me and considered in my medical decision making (see chart for details).    Benign exam.  Treat symptomatically for now.  COVID19 PCR pending. Followup with  Family Doctor if not improved in about 10 days.   Final Clinical Impressions(s) / UC Diagnoses   Final diagnoses:  Viral URI with cough     Discharge Instructions     Take plain guaifenesin (1200mg  extended release tabs such as Mucinex) twice daily, with plenty of water, for cough and congestion. Get adequate rest.   May use  Afrin nasal spray (or generic oxymetazoline) each morning for about 5 days and then discontinue.  Also recommend using saline nasal spray several times daily and saline nasal irrigation (AYR is a common brand).  Use Flonase nasal spray each morning after using Afrin nasal spray and saline nasal irrigation. Try warm salt water gargles for sore throat.  Stop all antihistamines for now (Zyrtec, etc), and other non-prescription cough/cold preparations. May take Ibuprofen 200mg , 4 tabs every 8 hours with food for chest/sternum discomfort. Begin Doxycycline if not improving about one week or if persistent fever develops (Given a prescription to hold, with an expiration date). May take Delsym Cough Suppressant ("12 Hour Cough Relief") at bedtime for nighttime cough.     If your COVID-19 test is positive, isolate yourself for five days from the time of your symptom onset. At the end of five days you may end isolation if your symptoms have cleared or improved, and you have not had a fever for 24 hours. At this time you should wear a mask for five more days when you are around others.   If symptoms become significantly worse during the night or over the weekend, proceed to the local emergency room.         ED Prescriptions    Medication Sig Dispense Auth. Provider   doxycycline (VIBRAMYCIN) 100 MG capsule Take one cap PO Q12hr with food. 14 capsule Kandra Nicolas, MD        Kandra Nicolas, MD 08/06/20 [phone]

## 2020-08-05 NOTE — Discharge Instructions (Addendum)
Take plain guaifenesin (1200mg  extended release tabs such as Mucinex) twice daily, with plenty of water, for cough and congestion. Get adequate rest.   May use Afrin nasal spray (or generic oxymetazoline) each morning for about 5 days and then discontinue.  Also recommend using saline nasal spray several times daily and saline nasal irrigation (AYR is a common brand).  Use Flonase nasal spray each morning after using Afrin nasal spray and saline nasal irrigation. Try warm salt water gargles for sore throat.  Stop all antihistamines for now (Zyrtec, etc), and other non-prescription cough/cold preparations. May take Ibuprofen 200mg , 4 tabs every 8 hours with food for chest/sternum discomfort. Begin Doxycycline if not improving about one week or if persistent fever develops  May take Delsym Cough Suppressant ("12 Hour Cough Relief") at bedtime for nighttime cough.     If your COVID-19 test is positive, isolate yourself for five days from the time of your symptom onset. At the end of five days you may end isolation if your symptoms have cleared or improved, and you have not had a fever for 24 hours. At this time you should wear a mask for five more days when you are around others.   If symptoms become significantly worse during the night or over the weekend, proceed to the local emergency room.

## 2020-08-07 LAB — SARS-COV-2, NAA 2 DAY TAT

## 2020-08-07 LAB — NOVEL CORONAVIRUS, NAA: SARS-CoV-2, NAA: NOT DETECTED

## 2020-12-11 DIAGNOSIS — R3129 Other microscopic hematuria: Secondary | ICD-10-CM | POA: Diagnosis not present

## 2020-12-11 DIAGNOSIS — Z79899 Other long term (current) drug therapy: Secondary | ICD-10-CM | POA: Diagnosis not present

## 2020-12-11 DIAGNOSIS — N951 Menopausal and female climacteric states: Secondary | ICD-10-CM | POA: Diagnosis not present

## 2020-12-11 DIAGNOSIS — K5900 Constipation, unspecified: Secondary | ICD-10-CM | POA: Diagnosis not present

## 2020-12-11 DIAGNOSIS — M158 Other polyosteoarthritis: Secondary | ICD-10-CM | POA: Diagnosis not present

## 2020-12-11 DIAGNOSIS — E7800 Pure hypercholesterolemia, unspecified: Secondary | ICD-10-CM | POA: Diagnosis not present

## 2020-12-11 DIAGNOSIS — Z0000 Encounter for general adult medical examination without abnormal findings: Secondary | ICD-10-CM | POA: Diagnosis not present

## 2020-12-12 ENCOUNTER — Other Ambulatory Visit: Payer: Self-pay | Admitting: Internal Medicine

## 2020-12-12 DIAGNOSIS — E7800 Pure hypercholesterolemia, unspecified: Secondary | ICD-10-CM

## 2020-12-12 DIAGNOSIS — R9431 Abnormal electrocardiogram [ECG] [EKG]: Secondary | ICD-10-CM

## 2020-12-22 DIAGNOSIS — Z23 Encounter for immunization: Secondary | ICD-10-CM | POA: Diagnosis not present

## 2021-01-02 ENCOUNTER — Other Ambulatory Visit: Payer: 59

## 2021-01-05 ENCOUNTER — Ambulatory Visit
Admission: RE | Admit: 2021-01-05 | Discharge: 2021-01-05 | Disposition: A | Discharge status: Home / Self Care | Payer: No Typology Code available for payment source | Source: Ambulatory Visit | Attending: Internal Medicine | Admitting: Internal Medicine

## 2021-01-05 DIAGNOSIS — E7800 Pure hypercholesterolemia, unspecified: Secondary | ICD-10-CM

## 2021-01-05 DIAGNOSIS — R9431 Abnormal electrocardiogram [ECG] [EKG]: Secondary | ICD-10-CM

## 2021-01-14 DIAGNOSIS — Z1159 Encounter for screening for other viral diseases: Secondary | ICD-10-CM | POA: Diagnosis not present

## 2021-02-05 DIAGNOSIS — E7800 Pure hypercholesterolemia, unspecified: Secondary | ICD-10-CM | POA: Diagnosis not present

## 2021-02-12 ENCOUNTER — Other Ambulatory Visit: Payer: Self-pay | Admitting: Obstetrics and Gynecology

## 2021-02-12 DIAGNOSIS — Z6821 Body mass index (BMI) 21.0-21.9, adult: Secondary | ICD-10-CM | POA: Diagnosis not present

## 2021-02-12 DIAGNOSIS — Z124 Encounter for screening for malignant neoplasm of cervix: Secondary | ICD-10-CM | POA: Diagnosis not present

## 2021-02-12 DIAGNOSIS — Z01419 Encounter for gynecological examination (general) (routine) without abnormal findings: Secondary | ICD-10-CM | POA: Diagnosis not present

## 2021-02-12 DIAGNOSIS — Z1231 Encounter for screening mammogram for malignant neoplasm of breast: Secondary | ICD-10-CM

## 2021-02-12 DIAGNOSIS — N958 Other specified menopausal and perimenopausal disorders: Secondary | ICD-10-CM | POA: Diagnosis not present

## 2021-03-02 DIAGNOSIS — L0889 Other specified local infections of the skin and subcutaneous tissue: Secondary | ICD-10-CM | POA: Diagnosis not present

## 2021-03-02 DIAGNOSIS — Z85820 Personal history of malignant melanoma of skin: Secondary | ICD-10-CM | POA: Diagnosis not present

## 2021-03-02 DIAGNOSIS — Z85828 Personal history of other malignant neoplasm of skin: Secondary | ICD-10-CM | POA: Diagnosis not present

## 2021-03-17 ENCOUNTER — Ambulatory Visit: Payer: Self-pay

## 2021-04-06 ENCOUNTER — Ambulatory Visit
Admission: RE | Admit: 2021-04-06 | Discharge: 2021-04-06 | Disposition: A | Discharge status: Home / Self Care | Payer: Medicare Other | Source: Ambulatory Visit | Attending: Obstetrics and Gynecology | Admitting: Obstetrics and Gynecology

## 2021-04-06 ENCOUNTER — Other Ambulatory Visit: Payer: Self-pay

## 2021-04-06 DIAGNOSIS — Z1231 Encounter for screening mammogram for malignant neoplasm of breast: Secondary | ICD-10-CM

## 2021-04-08 DIAGNOSIS — Z85820 Personal history of malignant melanoma of skin: Secondary | ICD-10-CM | POA: Diagnosis not present

## 2021-04-08 DIAGNOSIS — L820 Inflamed seborrheic keratosis: Secondary | ICD-10-CM | POA: Diagnosis not present

## 2021-04-08 DIAGNOSIS — L603 Nail dystrophy: Secondary | ICD-10-CM | POA: Diagnosis not present

## 2021-04-08 DIAGNOSIS — Z85828 Personal history of other malignant neoplasm of skin: Secondary | ICD-10-CM | POA: Diagnosis not present

## 2021-10-14 DIAGNOSIS — L820 Inflamed seborrheic keratosis: Secondary | ICD-10-CM | POA: Diagnosis not present

## 2021-10-14 DIAGNOSIS — Z85828 Personal history of other malignant neoplasm of skin: Secondary | ICD-10-CM | POA: Diagnosis not present

## 2021-10-14 DIAGNOSIS — D2261 Melanocytic nevi of right upper limb, including shoulder: Secondary | ICD-10-CM | POA: Diagnosis not present

## 2021-10-14 DIAGNOSIS — Z85820 Personal history of malignant melanoma of skin: Secondary | ICD-10-CM | POA: Diagnosis not present

## 2021-12-09 DIAGNOSIS — H524 Presbyopia: Secondary | ICD-10-CM | POA: Diagnosis not present

## 2021-12-09 DIAGNOSIS — D485 Neoplasm of uncertain behavior of skin: Secondary | ICD-10-CM | POA: Diagnosis not present

## 2021-12-09 DIAGNOSIS — Z85828 Personal history of other malignant neoplasm of skin: Secondary | ICD-10-CM | POA: Diagnosis not present

## 2021-12-09 DIAGNOSIS — Z85820 Personal history of malignant melanoma of skin: Secondary | ICD-10-CM | POA: Diagnosis not present

## 2021-12-30 ENCOUNTER — Ambulatory Visit: Payer: Medicare Other | Admitting: Internal Medicine

## 2022-01-05 DIAGNOSIS — Z0000 Encounter for general adult medical examination without abnormal findings: Secondary | ICD-10-CM | POA: Diagnosis not present

## 2022-01-05 DIAGNOSIS — R3129 Other microscopic hematuria: Secondary | ICD-10-CM | POA: Diagnosis not present

## 2022-01-05 DIAGNOSIS — M158 Other polyosteoarthritis: Secondary | ICD-10-CM | POA: Diagnosis not present

## 2022-01-05 DIAGNOSIS — N951 Menopausal and female climacteric states: Secondary | ICD-10-CM | POA: Diagnosis not present

## 2022-01-05 DIAGNOSIS — F322 Major depressive disorder, single episode, severe without psychotic features: Secondary | ICD-10-CM | POA: Diagnosis not present

## 2022-01-05 DIAGNOSIS — Z79899 Other long term (current) drug therapy: Secondary | ICD-10-CM | POA: Diagnosis not present

## 2022-01-05 DIAGNOSIS — K5900 Constipation, unspecified: Secondary | ICD-10-CM | POA: Diagnosis not present

## 2022-01-05 DIAGNOSIS — E7800 Pure hypercholesterolemia, unspecified: Secondary | ICD-10-CM | POA: Diagnosis not present

## 2022-01-15 ENCOUNTER — Ambulatory Visit: Payer: Medicare Other | Admitting: Internal Medicine

## 2022-01-16 DIAGNOSIS — E7800 Pure hypercholesterolemia, unspecified: Secondary | ICD-10-CM | POA: Insufficient documentation

## 2022-01-16 NOTE — Progress Notes (Unsigned)
Subjective:    Patient ID: Ashley Norton, female    DOB: [birth year], 66 y.o.   MRN: [id number]     HPI She is here to establish with a new pcp.  Torey is here for follow up of her chronic medical problems, including   Did not tolerate Crestor due to myalgias.  She stopped the medication and the muscle pain went away.  Mostly the muscle pain were in her upper legs.  She went back to taking the medication at every other day, but leg pain recurred.  She has been off of it for 2 weeks and still has some residual leg pain.   Has chronic lower back with gardening - not new  Her father had colon cancer and she gets a colonoscopy every 5 years.  She is due next year.  She has been taking ibuprofen for her myalgias.  Her former PCP did give her a PPI for her to take while taking all the ibuprofen to help prevent gastritis.  Her husband died from [place].  Since then she has had increased anxiety and has not slept well.  She takes Ambien as needed for sleep.  She has been taking Xanax as needed for anxiety.  Initially she was taking it more often, but has gotten herself down to taking it once a day-typically at night.    Medications and allergies reviewed with patient and updated if appropriate.  Current Outpatient Medications on File Prior to Visit  Medication Sig Dispense Refill   ALPRAZolam (XANAX) 0.25 MG tablet Take 1 tablet by mouth 2 (two) times daily as needed.     ascorbic acid (VITAMIN C) 1000 MG tablet Vitamin C 1,000 mg tablet     buPROPion (WELLBUTRIN XL) 150 MG 24 hr tablet Take 150 mg by mouth at bedtime.     cetirizine (ZYRTEC ALLERGY) 10 MG tablet 1 tablet     Coenzyme Q10 100 MG TABS Take by mouth.     fluticasone (FLONASE) 50 MCG/ACT nasal spray Place into both nostrils daily.     Multiple Vitamins-Minerals (MULTI FOR HER PO) Take by mouth.     ondansetron (ZOFRAN) 8 MG tablet Take 8 mg by mouth every 8 (eight) hours as needed.     ondansetron (ZOFRAN-ODT) 8 MG  disintegrating tablet Take 1 tablet (8 mg total) by mouth every 8 (eight) hours as needed for nausea or vomiting. 10 tablet 0   pantoprazole (PROTONIX) 40 MG tablet 1 tablet     Vitamin D, Ergocalciferol, (DRISDOL) 50000 units CAPS capsule Take 50,000 Units by mouth 2 (two) times a week.     Zinc 30 MG TABS Take by mouth.     zolpidem (AMBIEN) 10 MG tablet Take 5 mg by mouth at bedtime as needed for sleep.     [DISCONTINUED] gabapentin (NEURONTIN) 300 MG capsule Take 1 capsule (300 mg total) by mouth at bedtime. (Patient not taking: Reported on 05/13/2015) 90 capsule 4   No current facility-administered medications on file prior to visit.     Review of Systems  Constitutional:  Negative for chills and fever.  Respiratory:  Negative for cough, shortness of breath and wheezing.   Cardiovascular:  Negative for chest pain, palpitations and leg swelling.  Musculoskeletal:  Positive for myalgias. Negative for arthralgias.  Neurological:  Negative for dizziness, light-headedness and headaches.       Objective:   Vitals:   01/18/22 1127  BP: 124/78  Pulse: 74  Temp: 98.1  F (36.7 C)  SpO2: 99%   BP Readings from Last 3 Encounters:  01/18/22 124/78  08/05/20 (!) 149/84  05/13/15 120/67   Wt Readings from Last 3 Encounters:  01/18/22 154 lb 9.6 oz (70.1 kg)  08/05/20 157 lb (71.2 kg)  03/06/13 164 lb 8 oz (74.6 kg)   Body mass index is 25.73 kg/m.    Physical Exam Constitutional:      General: She is not in acute distress.    Appearance: Normal appearance.  HENT:     Head: Normocephalic and atraumatic.  Eyes:     Conjunctiva/sclera: Conjunctivae normal.  Cardiovascular:     Rate and Rhythm: Normal rate and regular rhythm.     Heart sounds: Normal heart sounds. No murmur heard. Pulmonary:     Effort: Pulmonary effort is normal. No respiratory distress.     Breath sounds: Normal breath sounds. No wheezing.  Abdominal:     General: There is no distension.     Palpations:  Abdomen is soft.     Tenderness: There is no abdominal tenderness.  Musculoskeletal:     Cervical back: Neck supple.     Right lower leg: No edema.     Left lower leg: No edema.  Lymphadenopathy:     Cervical: No cervical adenopathy.  Skin:    General: Skin is warm and dry.     Findings: No rash.  Neurological:     Mental Status: She is alert. Mental status is at baseline.  Psychiatric:        Mood and Affect: Mood normal.        Behavior: Behavior normal.        Lab Results  Component Value Date   WBC 3.1 (L) 05/13/2015   HGB 11.8 (L) 05/13/2015   HCT 37.2 05/13/2015   PLT 181 05/13/2015   GLUCOSE 91 05/13/2015   ALT 18 01/22/2013   AST 14 01/22/2013   NA 141 05/13/2015   K 3.4 (L) 05/13/2015   CL 106 05/13/2015   CREATININE 0.76 05/13/2015   BUN 5 (L) 05/13/2015   CO2 25 05/13/2015     Assessment & Plan:    See Problem List for Assessment and Plan of chronic medical problems.

## 2022-01-18 ENCOUNTER — Ambulatory Visit (INDEPENDENT_AMBULATORY_CARE_PROVIDER_SITE_OTHER): Payer: Medicare Other | Admitting: Internal Medicine

## 2022-01-18 ENCOUNTER — Encounter: Payer: Self-pay | Admitting: Internal Medicine

## 2022-01-18 DIAGNOSIS — Z85820 Personal history of malignant melanoma of skin: Secondary | ICD-10-CM

## 2022-01-18 DIAGNOSIS — E7800 Pure hypercholesterolemia, unspecified: Secondary | ICD-10-CM

## 2022-01-18 DIAGNOSIS — M791 Myalgia, unspecified site: Secondary | ICD-10-CM

## 2022-01-18 DIAGNOSIS — Z85828 Personal history of other malignant neoplasm of skin: Secondary | ICD-10-CM

## 2022-01-18 DIAGNOSIS — Z853 Personal history of malignant neoplasm of breast: Secondary | ICD-10-CM | POA: Diagnosis not present

## 2022-01-18 MED ORDER — SCOPOLAMINE 1 MG/3DAYS TD PT72: 1.0000 | MEDICATED_PATCH | TRANSDERMAL | 0 refills | Status: DC

## 2022-01-18 NOTE — Assessment & Plan Note (Signed)
Following with Dr. Wilhemina Bonito every 6 months

## 2022-01-18 NOTE — Assessment & Plan Note (Signed)
History of right-sided breast cancer Follows with GYN annually Mammogram up-to-date No evidence of recurrence

## 2022-01-18 NOTE — Assessment & Plan Note (Signed)
Subacute Myalgias started approximately 6 months after starting Crestor-they resolved when she stopped the medication for 2 weeks.  She retried the medication and even decrease the dose, but muscle pain recurred and upper legs.  Has been off Crestor for 2 weeks-pain is better, but still there Continue off Crestor She would like to consider a different statin-we will wait until her symptoms completely resolve-if they do not may need further evaluation-she will update me in the next few weeks

## 2022-01-18 NOTE — Patient Instructions (Addendum)
     Medications changes include :   none      Return for Physical Exam in August 2024.

## 2022-02-01 ENCOUNTER — Other Ambulatory Visit: Payer: Self-pay | Admitting: Obstetrics and Gynecology

## 2022-02-01 DIAGNOSIS — Z1231 Encounter for screening mammogram for malignant neoplasm of breast: Secondary | ICD-10-CM

## 2022-02-03 ENCOUNTER — Other Ambulatory Visit: Payer: Self-pay | Admitting: Internal Medicine

## 2022-02-03 ENCOUNTER — Encounter: Payer: Self-pay | Admitting: Internal Medicine

## 2022-02-03 DIAGNOSIS — E7800 Pure hypercholesterolemia, unspecified: Secondary | ICD-10-CM

## 2022-02-04 MED ORDER — ALPRAZOLAM 0.25 MG PO TABS: 0.2500 mg | ORAL_TABLET | Freq: Two times a day (BID) | ORAL | 2 refills | Status: DC | PRN

## 2022-02-04 MED ORDER — ATORVASTATIN CALCIUM 10 MG PO TABS: 10.0000 mg | ORAL_TABLET | Freq: Every day | ORAL | 3 refills | Status: DC

## 2022-02-22 DIAGNOSIS — M25551 Pain in right hip: Secondary | ICD-10-CM | POA: Diagnosis not present

## 2022-02-22 DIAGNOSIS — M79645 Pain in left finger(s): Secondary | ICD-10-CM | POA: Diagnosis not present

## 2022-02-22 DIAGNOSIS — M79644 Pain in right finger(s): Secondary | ICD-10-CM | POA: Diagnosis not present

## 2022-02-23 DIAGNOSIS — Z6826 Body mass index (BMI) 26.0-26.9, adult: Secondary | ICD-10-CM | POA: Diagnosis not present

## 2022-02-23 DIAGNOSIS — Z01419 Encounter for gynecological examination (general) (routine) without abnormal findings: Secondary | ICD-10-CM | POA: Diagnosis not present

## 2022-03-01 ENCOUNTER — Ambulatory Visit: Payer: Medicare Other | Admitting: Internal Medicine

## 2022-03-01 DIAGNOSIS — M6281 Muscle weakness (generalized): Secondary | ICD-10-CM | POA: Diagnosis not present

## 2022-03-01 DIAGNOSIS — M5416 Radiculopathy, lumbar region: Secondary | ICD-10-CM | POA: Diagnosis not present

## 2022-03-09 ENCOUNTER — Other Ambulatory Visit: Payer: Self-pay

## 2022-03-09 ENCOUNTER — Telehealth: Payer: Self-pay | Admitting: Internal Medicine

## 2022-03-09 MED ORDER — ALPRAZOLAM 0.25 MG PO TABS: 0.2500 mg | ORAL_TABLET | Freq: Two times a day (BID) | ORAL | 2 refills | Status: DC | PRN

## 2022-03-09 MED ORDER — BUPROPION HCL ER (XL) 150 MG PO TB24: 150.0000 mg | ORAL_TABLET | Freq: Every day | ORAL | 3 refills | Status: DC

## 2022-03-09 NOTE — Telephone Encounter (Signed)
Patient would like to talk to [facility] about the blood tests that Dr. Quay Burow would like to have her take.  Please give patient a call.

## 2022-03-09 NOTE — Addendum Note (Signed)
Addended by: Binnie Rail on: 03/09/2022 04:35 PM   Modules accepted: Orders

## 2022-03-10 ENCOUNTER — Other Ambulatory Visit (INDEPENDENT_AMBULATORY_CARE_PROVIDER_SITE_OTHER): Payer: Medicare Other

## 2022-03-10 DIAGNOSIS — E7800 Pure hypercholesterolemia, unspecified: Secondary | ICD-10-CM | POA: Diagnosis not present

## 2022-03-10 LAB — HEPATIC FUNCTION PANEL
ALT: 21 U/L (ref 0–35)
AST: 18 U/L (ref 0–37)
Albumin: 4.2 g/dL (ref 3.5–5.2)
Alkaline Phosphatase: 70 U/L (ref 39–117)
Bilirubin, Direct: 0.1 mg/dL (ref 0.0–0.3)
Total Bilirubin: 0.6 mg/dL (ref 0.2–1.2)
Total Protein: 7.1 g/dL (ref 6.0–8.3)

## 2022-03-10 LAB — LIPID PANEL
Cholesterol: 160 mg/dL (ref 0–200)
HDL: 52.1 mg/dL (ref >39.00)
LDL Cholesterol: 83 mg/dL (ref 0–99)
NonHDL: 107.57
Total CHOL/HDL Ratio: 3
Triglycerides: 124 mg/dL (ref 0.0–149.0)
VLDL: 24.8 mg/dL (ref 0.0–40.0)

## 2022-04-12 ENCOUNTER — Ambulatory Visit: Payer: Medicare Other

## 2022-04-12 ENCOUNTER — Encounter: Payer: Self-pay | Admitting: Internal Medicine

## 2022-04-12 NOTE — Progress Notes (Unsigned)
    Subjective:    Patient ID: Ashley Norton, female    DOB: Apr 13, 1956, 66 y.o.   MRN: [id number]      HPI Ashley Norton is here for No chief complaint on file.   She is here for an acute visit for cold symptoms.  ? Sinus infection  Her symptoms started   She is experiencing   She has tried taking     Covid x 2 neg at home.  Medications and allergies reviewed with patient and updated if appropriate.  Current Outpatient Medications on File Prior to Visit  Medication Sig Dispense Refill   ALPRAZolam (XANAX) 0.25 MG tablet Take 1 tablet (0.25 mg total) by mouth 2 (two) times daily as needed. 30 tablet 2   ascorbic acid (VITAMIN C) 1000 MG tablet Vitamin C 1,000 mg tablet     atorvastatin (LIPITOR) 10 MG tablet Take 1 tablet (10 mg total) by mouth daily. 90 tablet 3   buPROPion (WELLBUTRIN XL) 150 MG 24 hr tablet Take 1 tablet (150 mg total) by mouth at bedtime. 90 tablet 3   cetirizine (ZYRTEC ALLERGY) 10 MG tablet 1 tablet     Coenzyme Q10 100 MG TABS Take by mouth.     fluticasone (FLONASE) 50 MCG/ACT nasal spray Place into both nostrils daily.     Multiple Vitamins-Minerals (MULTI FOR HER PO) Take by mouth.     ondansetron (ZOFRAN) 8 MG tablet Take 8 mg by mouth every 8 (eight) hours as needed.     ondansetron (ZOFRAN-ODT) 8 MG disintegrating tablet Take 1 tablet (8 mg total) by mouth every 8 (eight) hours as needed for nausea or vomiting. 10 tablet 0   pantoprazole (PROTONIX) 40 MG tablet 1 tablet     scopolamine (TRANSDERM-SCOP) 1 MG/3DAYS Place 1 patch (1.5 mg total) onto the skin every 3 (three) days. 10 patch 0   Vitamin D, Ergocalciferol, (DRISDOL) 50000 units CAPS capsule Take 50,000 Units by mouth 2 (two) times a week.     Zinc 30 MG TABS Take by mouth.     zolpidem (AMBIEN) 10 MG tablet Take 5 mg by mouth at bedtime as needed for sleep.     [DISCONTINUED] gabapentin (NEURONTIN) 300 MG capsule Take 1 capsule (300 mg total) by mouth at bedtime. (Patient not taking:  Reported on 05/13/2015) 90 capsule 4   No current facility-administered medications on file prior to visit.    Review of Systems     Objective:  There were no vitals filed for this visit. BP Readings from Last 3 Encounters:  01/18/22 124/78  08/05/20 (!) 149/84  05/13/15 120/67   Wt Readings from Last 3 Encounters:  01/18/22 154 lb 9.6 oz (70.1 kg)  08/05/20 157 lb (71.2 kg)  03/06/13 164 lb 8 oz (74.6 kg)   There is no height or weight on file to calculate BMI.    Physical Exam         Assessment & Plan:    See Problem List for Assessment and Plan of chronic medical problems.

## 2022-04-13 ENCOUNTER — Ambulatory Visit (INDEPENDENT_AMBULATORY_CARE_PROVIDER_SITE_OTHER): Payer: Medicare Other | Admitting: Internal Medicine

## 2022-04-13 VITALS — BP 116/78 | HR 79 | Temp 98.5°F | Ht 65.0 in | Wt 154.0 lb

## 2022-04-13 DIAGNOSIS — J0100 Acute maxillary sinusitis, unspecified: Secondary | ICD-10-CM

## 2022-04-13 DIAGNOSIS — J019 Acute sinusitis, unspecified: Secondary | ICD-10-CM | POA: Insufficient documentation

## 2022-04-13 MED ORDER — HYDROCOD POLI-CHLORPHE POLI ER 10-8 MG/5ML PO SUER: 5.0000 mL | Freq: Two times a day (BID) | ORAL | 0 refills | Status: DC | PRN

## 2022-04-13 MED ORDER — CEFDINIR 300 MG PO CAPS: 300.0000 mg | ORAL_CAPSULE | Freq: Two times a day (BID) | ORAL | 0 refills | Status: DC

## 2022-04-13 NOTE — Patient Instructions (Addendum)
      Medications changes include :   omnicef 300 mg twice daily for 10 days.   Take advil as needed.  Cough syrup for night.        Return if symptoms worsen or fail to improve.

## 2022-04-13 NOTE — Assessment & Plan Note (Signed)
Acute Likely bacterial  Start Omnicef 300 mg BID x 10 day Tussionex cough syrup otc cold medications Rest, fluid Call if no improvement

## 2022-04-14 ENCOUNTER — Ambulatory Visit: Payer: Medicare Other | Admitting: Internal Medicine

## 2022-04-14 DIAGNOSIS — H25813 Combined forms of age-related cataract, bilateral: Secondary | ICD-10-CM | POA: Diagnosis not present

## 2022-04-15 ENCOUNTER — Telehealth: Payer: Self-pay | Admitting: Internal Medicine

## 2022-04-15 NOTE — Telephone Encounter (Signed)
LVM for pt to rtn my call to schedule AWV with [facility] call back # [phone] 

## 2022-04-19 DIAGNOSIS — Z85828 Personal history of other malignant neoplasm of skin: Secondary | ICD-10-CM | POA: Diagnosis not present

## 2022-04-19 DIAGNOSIS — Z85820 Personal history of malignant melanoma of skin: Secondary | ICD-10-CM | POA: Diagnosis not present

## 2022-04-19 DIAGNOSIS — L570 Actinic keratosis: Secondary | ICD-10-CM | POA: Diagnosis not present

## 2022-04-19 DIAGNOSIS — L820 Inflamed seborrheic keratosis: Secondary | ICD-10-CM | POA: Diagnosis not present

## 2022-04-21 NOTE — Progress Notes (Unsigned)
Subjective:   Ashley Norton is a 66 y.o. female who presents for an Initial Medicare Annual Wellness Visit. I connected with  Michelene Heady on 04/22/22 by a audio enabled telemedicine application and verified that I am speaking with the correct person using two identifiers.  Patient Location: Home  Provider Location: Home Office  I discussed the limitations of evaluation and management by telemedicine. The patient expressed understanding and agreed to proceed.  Review of Systems    Deferred to PCP Cardiac Risk Factors include: advanced age (>70mn, >>77women);dyslipidemia     Objective:    There were no vitals filed for this visit. There is no height or weight on file to calculate BMI.     04/22/2022    8:48 AM 05/13/2015    4:51 PM  Advanced Directives  Does Patient Have a Medical Advance Directive? Yes No  Type of [place]   Does patient want to make changes to medical advance directive? No - Patient declined   Copy of [place]in Chart? No - copy requested   Would patient like information on creating a medical advance directive?  No - patient declined information    Current Medications (verified) Outpatient Encounter Medications as of 04/22/2022  Medication Sig   ALPRAZolam (XANAX) 0.25 MG tablet Take 1 tablet (0.25 mg total) by mouth 2 (two) times daily as needed.   ascorbic acid (VITAMIN C) 1000 MG tablet Vitamin C 1,000 mg tablet   atorvastatin (LIPITOR) 10 MG tablet Take 1 tablet (10 mg total) by mouth daily.   buPROPion (WELLBUTRIN XL) 150 MG 24 hr tablet Take 1 tablet (150 mg total) by mouth at bedtime.   chlorpheniramine-HYDROcodone (TUSSIONEX) 10-8 MG/5ML Take 5 mLs by mouth every 12 (twelve) hours as needed.   Coenzyme Q10 100 MG TABS Take by mouth.   fluticasone (FLONASE) 50 MCG/ACT nasal spray Place into both nostrils daily.   Loratadine (CLARITIN PO) Take by mouth.   Multiple  Vitamins-Minerals (MULTI FOR HER PO) Take by mouth.   ondansetron (ZOFRAN) 8 MG tablet Take 8 mg by mouth every 8 (eight) hours as needed.   ondansetron (ZOFRAN-ODT) 8 MG disintegrating tablet Take 1 tablet (8 mg total) by mouth every 8 (eight) hours as needed for nausea or vomiting.   pantoprazole (PROTONIX) 40 MG tablet 1 tablet   scopolamine (TRANSDERM-SCOP) 1 MG/3DAYS Place 1 patch (1.5 mg total) onto the skin every 3 (three) days.   Vitamin D, Ergocalciferol, (DRISDOL) 50000 units CAPS capsule Take 50,000 Units by mouth 2 (two) times a week.   Zinc 30 MG TABS Take by mouth.   zolpidem (AMBIEN) 10 MG tablet Take 5 mg by mouth at bedtime as needed for sleep.   cefdinir (OMNICEF) 300 MG capsule Take 1 capsule (300 mg total) by mouth 2 (two) times daily. (Patient not taking: Reported on 04/22/2022)   cetirizine (ZYRTEC ALLERGY) 10 MG tablet 1 tablet (Patient not taking: Reported on 04/13/2022)   [DISCONTINUED] gabapentin (NEURONTIN) 300 MG capsule Take 1 capsule (300 mg total) by mouth at bedtime. (Patient not taking: Reported on 05/13/2015)   No facility-administered encounter medications on file as of 04/22/2022.    Allergies (verified) Other, Crestor [rosuvastatin], and Penicillins   History: Past Medical History:  Diagnosis Date   [place]   [place]   COVID 2020   Personal history of radiation therapy 2009   [place] 82   Past Surgical History:  Procedure Laterality Date   BREAST LUMPECTOMY Right 2009   CHOLECYSTECTOMY     Family History  Problem Relation Age of Onset   Hypertension Mother    Dementia Mother    Colon cancer Father    Social History   Socioeconomic History   Marital status: Widowed    Spouse name: Not on file   Number of children: 2   Years of education: Not on file   Highest education level: Not on file  Occupational History   Not on file  Tobacco Use   Smoking status: Never   Smokeless tobacco: Never  Vaping  Use   Vaping Use: Never used  Substance and Sexual Activity   Alcohol use: Yes    Comment: Occasionally   Drug use: No   Sexual activity: Not on file  Other Topics Concern   Not on file  Social History Narrative   Not on file   Social Determinants of Health   Financial Resource Strain: Low Risk  (04/22/2022)   Overall Financial Resource Strain (CARDIA)    Difficulty of Paying Living Expenses: Not hard at all  Food Insecurity: No Food Insecurity (04/22/2022)   Hunger Vital Sign    Worried About Running Out of Food in the Last Year: Never true    Ran Out of Food in the Last Year: Never true  Transportation Needs: No Transportation Needs (04/22/2022)   PRAPARE - Hydrologist (Medical): No    Lack of Transportation (Non-Medical): No  Physical Activity: Sufficiently Active (04/22/2022)   Exercise Vital Sign    Days of Exercise per Week: 5 days    Minutes of Exercise per Session: 40 min  Stress: No Stress Concern Present (04/22/2022)   [place]    Feeling of Stress : Not at all  Social Connections: Moderately Integrated (04/22/2022)   Social Connection and Isolation Panel [NHANES]    Frequency of Communication with Friends and Family: More than three times a week    Frequency of Social Gatherings with Friends and Family: More than three times a week    Attends Religious Services: More than 4 times per year    Active Member of [employer] or Organizations: Yes    Attends Archivist Meetings: More than 4 times per year    Marital Status: Widowed    Tobacco Counseling Counseling given: Not Answered   Clinical Intake:  Pre-visit preparation completed: Yes  Pain : No/denies pain     Nutritional Status: BMI 25 -29 Overweight Nutritional Risks: None Diabetes: No  How often do you need to have someone help you when you read instructions, pamphlets, or other written materials  from your doctor or pharmacy?: 1 - Never  Diabetic?No  Interpreter Needed?: No  Information entered by :: Emelia Loron RN   Activities of Daily Living    04/22/2022    8:46 AM  In your present state of health, do you have any difficulty performing the following activities:  Hearing? 0  Vision? 0  Difficulty concentrating or making decisions? 0  Walking or climbing stairs? 0  Dressing or bathing? 0  Doing errands, shopping? 0  Preparing Food and eating ? N  Using the Toilet? N  In the past six months, have you accidently leaked urine? N  Do you have problems with loss of bowel control? N  Managing your Medications? N  Managing  your Finances? N  Housekeeping or managing your Housekeeping? N    Patient Care Team: Binnie Rail, MD as PCP - General (Internal Medicine)  Indicate any recent Medical Services you may have received from other than [facility] providers in the past year (date may be approximate).     Assessment:   This is a routine wellness examination for Hilarie.  Hearing/Vision screen No results found.  Dietary issues and exercise activities discussed: Current Exercise Habits: Home exercise routine, Type of exercise: walking, Time (Minutes): 40, Frequency (Times/Week): 5, Weekly Exercise (Minutes/Week): 200, Intensity: Mild, Exercise limited by: None identified   Goals Addressed             This Visit's Progress    Patient Stated       Lose weight by continuing to walk and eat healthier.       Depression Screen    04/22/2022    8:41 AM 04/13/2022   10:39 AM 01/18/2022   12:59 PM  PHQ 2/9 Scores  PHQ - 2 Score 0 0 0  PHQ- 9 Score  0     Fall Risk    04/22/2022    8:50 AM 04/13/2022   10:39 AM 01/18/2022   12:58 PM 01/18/2022   11:46 AM  Fall Risk   Falls in the past year? 0 0 0 0  Number falls in past yr: 0 0 0 0  Injury with Fall? 0 0 0 0  Risk for fall due to : No Fall Risks No Fall Risks No Fall Risks No Fall Risks  Follow up Falls evaluation  completed Falls evaluation completed Falls evaluation completed Falls evaluation completed    [place]:  Any stairs in or around the home? Yes  If so, are there any without handrails? Yes  Home free of loose throw rugs in walkways, pet beds, electrical cords, etc? Yes  Adequate lighting in your home to reduce risk of falls? Yes   ASSISTIVE DEVICES UTILIZED TO PREVENT FALLS:  Life alert? No  Use of a cane, walker or w/c? No  Grab bars in the bathroom? No  Shower chair or bench in shower? No  Elevated toilet seat or a handicapped toilet? No   Cognitive Function:        04/22/2022    8:48 AM  6CIT Screen  What Year? 0 points  What month? 0 points  What time? 0 points  Count back from 20 0 points  Months in reverse 0 points  Repeat phrase 0 points  Total Score 0 points    Immunizations Immunization History  Administered Date(s) Administered   Influenza-Unspecified 02/02/2020, 03/02/2022   Unspecified SARS-COV-2 Vaccination 08/23/2019    TDAP status: Due, Education has been provided regarding the importance of this vaccine. Advised may receive this vaccine at local pharmacy or Health Dept. Aware to provide a copy of the vaccination record if obtained from local pharmacy or Health Dept. Verbalized acceptance and understanding.  Flu Vaccine status: Up to date  Pneumococcal vaccine status: Due, Education has been provided regarding the importance of this vaccine. Advised may receive this vaccine at local pharmacy or Health Dept. Aware to provide a copy of the vaccination record if obtained from local pharmacy or Health Dept. Verbalized acceptance and understanding.  Covid-19 vaccine status: Information provided on how to obtain vaccines.   Qualifies for Shingles Vaccine? Yes   Zostavax completed Yes   Shingrix Completed?: No.    Education has been  provided regarding the importance of this vaccine. Patient has been advised to call insurance  company to determine out of pocket expense if they have not yet received this vaccine. Advised may also receive vaccine at local pharmacy or Health Dept. Verbalized acceptance and understanding.  Screening Tests Health Maintenance  Topic Date Due   Hepatitis C Screening  Never done   DTaP/Tdap/Td (1 - Tdap) Never done   Zoster Vaccines- Shingrix (1 of 2) Never done   COLONOSCOPY (Pts 45-42yr Insurance coverage will need to be confirmed)  Never done   COVID-19 Vaccine (2 - Mixed Product risk series) 09/20/2019   Pneumonia Vaccine 66 Years old (1 - PCV) Never done   DEXA SCAN  Never done   MAMMOGRAM  04/07/2023   Medicare Annual Wellness (AWV)  04/23/2023   INFLUENZA VACCINE  Completed   HPV VACCINES  Aged Out    Health Maintenance  Health Maintenance Due  Topic Date Due   Hepatitis C Screening  Never done   DTaP/Tdap/Td (1 - Tdap) Never done   Zoster Vaccines- Shingrix (1 of 2) Never done   COLONOSCOPY (Pts 45-440yrInsurance coverage will need to be confirmed)  Never done   COVID-19 Vaccine (2 - Mixed Product risk series) 09/20/2019   Pneumonia Vaccine 6558Years old (1 - PCV) Never done   DEXA SCAN  Never done    Colorectal Cancer Screening: [facility]  Mammogram status: Ordered has an appointment 06/07/22. Pt provided with contact info and advised to call to schedule appt.   Bone Density Status: Patient reports every 2 years by her GYN provider  Lung Cancer Screening: (Low Dose CT Chest recommended if Age 66-80ears, 30 pack-year currently smoking OR have quit w/in 15years.) does not qualify.   Additional Screening:  Hepatitis C Screening: does not qualify; Completed education provided  Vision Screening: Recommended annual ophthalmology exams for early detection of glaucoma and other disorders of the eye. Is the patient up to date with their annual eye exam?  Yes  Who is the provider or what is the name of the  office in which the patient attends annual eye exams? [facility], would they like to be referred to a provider to establish care?  N/A .   Dental Screening: Recommended annual dental exams for proper oral hygiene  Community Resource Referral / Chronic Care Management: CRR required this visit?  No   CCM required this visit?  No      Plan:     I have personally reviewed and noted the following in the patient's chart:   Medical and social history Use of alcohol, tobacco or illicit drugs  Current medications and supplements including opioid prescriptions. Patient is not currently taking opioid prescriptions. Functional ability and status Nutritional status Physical activity Advanced directives List of other physicians Hospitalizations, surgeries, and ER visits in previous 12 months Vitals Screenings to include cognitive, depression, and falls Referrals and appointments  In addition, I have reviewed and discussed with patient certain preventive protocols, quality metrics, and best practice recommendations. A written personalized care plan for preventive services as well as general preventive health recommendations were provided to patient.     JiMichiel CowboyRN   04/22/2022   Nurse Notes:  Ms. HoBorras Thank you for taking time to come for your Medicare Wellness Visit. I appreciate your ongoing commitment to your health goals. Please review  the following plan we discussed and let me know if I can assist you in the future.   These are the goals we discussed:  Goals      Patient Stated     Lose weight by continuing to walk and eat healthier.         This is a list of the screening recommended for you and due dates:  Health Maintenance  Topic Date Due   Hepatitis C Screening: USPSTF Recommendation to screen - Ages 60-79 yo.  Never done   DTaP/Tdap/Td vaccine (1 - Tdap) Never done   Zoster (Shingles) Vaccine (1 of 2) Never  done   Colon Cancer Screening  Never done   COVID-19 Vaccine (2 - Mixed Product risk series) 09/20/2019   Pneumonia Vaccine (1 - PCV) Never done   DEXA scan (bone density measurement)  Never done   Mammogram  04/07/2023   Medicare Annual Wellness Visit  04/23/2023   Flu Shot  Completed   HPV Vaccine  Aged Out

## 2022-04-21 NOTE — Patient Instructions (Signed)

## 2022-04-22 ENCOUNTER — Ambulatory Visit: Payer: Medicare Other | Admitting: *Deleted

## 2022-04-22 DIAGNOSIS — Z0000 Encounter for general adult medical examination without abnormal findings: Secondary | ICD-10-CM

## 2022-04-27 ENCOUNTER — Emergency Department (HOSPITAL_BASED_OUTPATIENT_CLINIC_OR_DEPARTMENT_OTHER): Payer: Medicare Other

## 2022-04-27 ENCOUNTER — Other Ambulatory Visit: Payer: Self-pay

## 2022-04-27 ENCOUNTER — Encounter (HOSPITAL_BASED_OUTPATIENT_CLINIC_OR_DEPARTMENT_OTHER): Payer: Self-pay

## 2022-04-27 ENCOUNTER — Emergency Department (HOSPITAL_BASED_OUTPATIENT_CLINIC_OR_DEPARTMENT_OTHER)
Admission: EM | Admit: 2022-04-27 | Discharge: 2022-04-27 | Disposition: A | Discharge status: Home / Self Care | Payer: Medicare Other | Attending: Emergency Medicine | Admitting: Emergency Medicine

## 2022-04-27 DIAGNOSIS — N201 Calculus of ureter: Secondary | ICD-10-CM | POA: Diagnosis not present

## 2022-04-27 DIAGNOSIS — K449 Diaphragmatic hernia without obstruction or gangrene: Secondary | ICD-10-CM | POA: Diagnosis not present

## 2022-04-27 DIAGNOSIS — R109 Unspecified abdominal pain: Secondary | ICD-10-CM | POA: Diagnosis not present

## 2022-04-27 LAB — CBC
HCT: 39.9 % (ref 36.0–46.0)
Hemoglobin: 13.1 g/dL (ref 12.0–15.0)
MCH: 29.3 pg (ref 26.0–34.0)
MCHC: 32.8 g/dL (ref 30.0–36.0)
MCV: 89.3 fL (ref 80.0–100.0)
Platelets: 293 10*3/uL (ref 150–400)
RBC: 4.47 MIL/uL (ref 3.87–5.11)
RDW: 12.7 % (ref 11.5–15.5)
WBC: 8.7 10*3/uL (ref 4.0–10.5)
nRBC: 0 % (ref 0.0–0.2)

## 2022-04-27 LAB — BASIC METABOLIC PANEL
Anion gap: 9 (ref 5–15)
BUN: 11 mg/dL (ref 8–23)
CO2: 27 mmol/L (ref 22–32)
Calcium: 9.5 mg/dL (ref 8.9–10.3)
Chloride: 102 mmol/L (ref 98–111)
Creatinine, Ser: 0.79 mg/dL (ref 0.44–1.00)
GFR, Estimated: >60 mL/min (ref >60)
Glucose, Bld: 109 mg/dL — ABNORMAL HIGH (ref 70–99)
Potassium: 4 mmol/L (ref 3.5–5.1)
Sodium: 138 mmol/L (ref 135–145)

## 2022-04-27 LAB — URINALYSIS, ROUTINE W REFLEX MICROSCOPIC
Bilirubin Urine: NEGATIVE
Glucose, UA: NEGATIVE mg/dL
Hgb urine dipstick: NEGATIVE
Ketones, ur: NEGATIVE mg/dL
Leukocytes,Ua: NEGATIVE
Nitrite: NEGATIVE
Protein, ur: NEGATIVE mg/dL
Specific Gravity, Urine: <1.005 — ABNORMAL LOW (ref 1.005–1.030)
pH: 6 (ref 5.0–8.0)

## 2022-04-27 MED ORDER — HYDROCODONE-ACETAMINOPHEN 5-325 MG PO TABS: 1.0000 | ORAL_TABLET | Freq: Four times a day (QID) | ORAL | 0 refills | Status: AC | PRN

## 2022-04-27 MED ORDER — IBUPROFEN 400 MG PO TABS
600.0000 mg | ORAL_TABLET | Freq: Once | ORAL | Status: AC
  Administered 2022-04-27: 600 mg via ORAL
  Filled 2022-04-27: qty 1

## 2022-04-27 MED ORDER — TAMSULOSIN HCL 0.4 MG PO CAPS: 0.4000 mg | ORAL_CAPSULE | Freq: Every day | ORAL | 0 refills | Status: AC

## 2022-04-27 MED ORDER — ONDANSETRON 4 MG PO TBDP: 4.0000 mg | ORAL_TABLET | Freq: Three times a day (TID) | ORAL | 0 refills | Status: AC | PRN

## 2022-04-27 MED ORDER — ALPRAZOLAM 0.5 MG PO TABS
0.2500 mg | ORAL_TABLET | Freq: Once | ORAL | Status: AC
  Administered 2022-04-27: 0.25 mg via ORAL
  Filled 2022-04-27: qty 1

## 2022-04-27 NOTE — ED Notes (Signed)
Patient transported to CT 

## 2022-04-27 NOTE — ED Provider Notes (Signed)
[name] Provider Note   CSN: [id number] Arrival date & time: 04/27/22  1341     History Chief Complaint  Patient presents with   Flank Pain    Ashley Norton is a 66 y.o. female.   Flank Pain Pertinent negatives include no abdominal pain.  Patient presents to the ER with complaints of left flank pain. She states that pain began this morning and woke her up out of sleep. No prior hx of renal stones. Denies any recent changes in medications or diet. Associated diarrhea this morning. Denies dysuria, hematuria, abdominal pain, nausea, vomiting.      Home Medications Prior to Admission medications   Medication Sig Start Date End Date Taking? Authorizing Provider  HYDROcodone-acetaminophen (NORCO/VICODIN) 5-325 MG tablet Take 1 tablet by mouth every 6 (six) hours as needed for up to 7 days. 04/27/22 05/04/22 Yes Luvenia Heller, PA-C  ondansetron (ZOFRAN-ODT) 4 MG disintegrating tablet Take 1 tablet (4 mg total) by mouth every 8 (eight) hours as needed for up to 14 days for nausea or vomiting. 04/27/22 05/11/22 Yes Luvenia Heller, PA-C  tamsulosin (FLOMAX) 0.4 MG CAPS capsule Take 1 capsule (0.4 mg total) by mouth daily for 14 days. 04/27/22 05/11/22 Yes Luvenia Heller, PA-C  ALPRAZolam (XANAX) 0.25 MG tablet Take 1 tablet (0.25 mg total) by mouth 2 (two) times daily as needed. 03/09/22   Binnie Rail, MD  ascorbic acid (VITAMIN C) 1000 MG tablet Vitamin C 1,000 mg tablet    [provider]  atorvastatin (LIPITOR) 10 MG tablet Take 1 tablet (10 mg total) by mouth daily. 02/04/22   Binnie Rail, MD  buPROPion (WELLBUTRIN XL) 150 MG 24 hr tablet Take 1 tablet (150 mg total) by mouth at bedtime. 03/09/22   Binnie Rail, MD  cefdinir (OMNICEF) 300 MG capsule Take 1 capsule (300 mg total) by mouth 2 (two) times daily. Patient not taking: Reported on 04/22/2022 04/13/22   Binnie Rail, MD  chlorpheniramine-HYDROcodone (TUSSIONEX) 10-8 MG/5ML  Take 5 mLs by mouth every 12 (twelve) hours as needed. 04/13/22   Binnie Rail, MD  Coenzyme Q10 100 MG TABS Take by mouth.    [provider]  fluticasone (FLONASE) 50 MCG/ACT nasal spray Place into both nostrils daily.    [provider]  Loratadine (CLARITIN PO) Take by mouth.    [provider]  Multiple Vitamins-Minerals (MULTI FOR HER PO) Take by mouth.    [provider]  ondansetron (ZOFRAN) 8 MG tablet Take 8 mg by mouth every 8 (eight) hours as needed. 01/04/22   [provider]  ondansetron (ZOFRAN-ODT) 8 MG disintegrating tablet Take 1 tablet (8 mg total) by mouth every 8 (eight) hours as needed for nausea or vomiting. 05/13/15   Davonna Belling, MD  pantoprazole (PROTONIX) 40 MG tablet 1 tablet 07/24/20   [provider]  RETIN-A 0.05 % cream Apply topically at bedtime. 04/19/22   [provider]  scopolamine (TRANSDERM-SCOP) 1 MG/3DAYS Place 1 patch (1.5 mg total) onto the skin every 3 (three) days. 01/18/22   Binnie Rail, MD  Vitamin D, Ergocalciferol, (DRISDOL) 50000 units CAPS capsule Take 50,000 Units by mouth 2 (two) times a week.    [provider]  Zinc 30 MG TABS Take by mouth.    [provider]  zolpidem (AMBIEN) 10 MG tablet Take 5 mg by mouth at bedtime as needed for sleep.    [provider]  gabapentin (NEURONTIN) 300 MG capsule Take 1 capsule (300 mg total) by mouth at bedtime. Patient not taking: Reported on 05/13/2015 06/05/12 08/05/20  [name], NP      Allergies    Other, Crestor [rosuvastatin], and Penicillins    Review of Systems   Review of Systems  Constitutional:  Negative for appetite change, chills, fatigue and fever.  Gastrointestinal:  Positive for diarrhea. Negative for abdominal pain, constipation, nausea and vomiting.  Genitourinary:  Positive for flank pain. Negative for difficulty urinating, dysuria and urgency.  All other systems reviewed and are  negative.   Physical Exam Updated Vital Signs BP 119/69   Pulse 64   Temp 98.3 F (36.8 C) (Oral)   Resp 20   Ht '5\' 5"'$  (1.651 m)   Wt 69.9 kg   SpO2 99%   BMI 25.64 kg/m  Physical Exam Vitals and nursing note reviewed.  Constitutional:      General: She is not in acute distress.    Appearance: Normal appearance. She is normal weight. She is not ill-appearing.  HENT:     Head: Normocephalic and atraumatic.  Eyes:     Conjunctiva/sclera: Conjunctivae normal.  Cardiovascular:     Rate and Rhythm: Normal rate and regular rhythm.     Pulses: Normal pulses.     Heart sounds: Normal heart sounds.  Pulmonary:     Effort: Pulmonary effort is normal.     Breath sounds: Normal breath sounds.  Abdominal:     General: Abdomen is flat. Bowel sounds are normal.     Tenderness: There is left CVA tenderness. There is no right CVA tenderness.  Musculoskeletal:        General: No tenderness. Normal range of motion.     Right lower leg: No edema.     Left lower leg: No edema.     Comments: Negative straight leg raise  Skin:    Capillary Refill: Capillary refill takes less than 2 seconds.  Neurological:     Mental Status: She is alert.     ED Results / Procedures / Treatments   Labs (all labs ordered are listed, but only abnormal results are displayed) Labs Reviewed  URINALYSIS, ROUTINE W REFLEX MICROSCOPIC - Abnormal; Notable for the following components:      Result Value   Color, Urine COLORLESS (*)    Specific Gravity, Urine <1.005 (*)    All other components within normal limits  BASIC METABOLIC PANEL - Abnormal; Notable for the following components:   Glucose, Bld 109 (*)    All other components within normal limits  CBC    EKG None  Radiology CT Renal Stone Study  Result Date: 04/27/2022 CLINICAL DATA:  Abdominal and flank pain. EXAM: CT ABDOMEN AND PELVIS WITHOUT CONTRAST TECHNIQUE: Multidetector CT imaging of the abdomen and pelvis was performed following the  standard protocol without IV contrast. RADIATION DOSE REDUCTION: This exam was performed according to the departmental dose-optimization program which includes automated exposure control, adjustment of the mA and/or kV according to patient size and/or use of iterative reconstruction technique. COMPARISON:  None Available. FINDINGS: Lower chest: No acute abnormality. Hepatobiliary: No focal liver abnormality is seen. Status post cholecystectomy. No biliary dilatation. Pancreas: Unremarkable. No pancreatic ductal dilatation or surrounding inflammatory changes. Spleen: Normal in size without focal abnormality. Adrenals/Urinary Tract: There is a punctate hyperdensity in the right ureterovesicular junction. There is mild prominence of the right renal collecting system. Otherwise, the kidneys, bladder, and adrenal glands are within  normal limits. Stomach/Bowel: There is a small hiatal hernia. Stomach is otherwise within normal limits. Appendix appears normal. No evidence of bowel wall thickening, distention, or inflammatory changes. Vascular/Lymphatic: No significant vascular findings are present. No enlarged abdominal or pelvic lymph nodes. Reproductive: Status post hysterectomy. No adnexal masses. Other: No abdominal wall hernia or abnormality. No abdominopelvic ascites. Musculoskeletal: No acute fractures are seen. Degenerative changes affect the spine. There is a small sclerotic focus in the L5 vertebral body, possibly a bone island. IMPRESSION: 1. Punctate calculus at the right ureterovesicular junction with mild prominence of the right renal collecting system. 2. Small hiatal hernia. Electronically Signed   By: Ronney Asters M.D.   On: 04/27/2022 17:31    Procedures Procedures   Medications Ordered in ED Medications  ALPRAZolam Duanne Moron) tablet 0.25 mg (0.25 mg Oral Given 04/27/22 1732)  ibuprofen (ADVIL) tablet 600 mg (600 mg Oral Given 04/27/22 1732)    ED Course/ Medical Decision Making/ A&P                            Medical Decision Making  This patient presents to the ED for concern of left flank pain. Differential diagnosis includes nephrolithiasis, AAA, pyelonephritis, muscle strain, lumbar disc herniation    Lab Tests:  I Ordered, and personally interpreted labs.  The pertinent results include:  Normal UA, BMP, and CBC   Imaging Studies ordered:  I ordered imaging studies including CT renal stone study  I independently visualized and interpreted imaging which showed stone on right UVJ I agree with the radiologist interpretation   Medicines ordered and prescription drug management:  I ordered medication including ibuprofen, Xanax for pain and anxiety  Reevaluation of the patient after these medicines showed that the patient improved I have reviewed the patients home medicines and have made adjustments as needed   Problem List / ED Course:  Patient presented to the ER with left flank pain that began this morning. She denied a prior history of stone, but reported that this feels similar to what others with stones have reported it feels like. She denied any urinary changes and no noticeable blood seen in urine. Based on findings on exam, CT stone study was performed which showed a stone in the right UVJ. Based on size at approximately 33m, stone is likely to pass on its own without issues. Advised patient of this and sent in medications for adequate analgesia, nausea, and expulsion therapy to facilitate clearance. Patient was agreeable to this plan and  verbalized understanding return precautions.  Final Clinical Impression(s) / ED Diagnoses Final diagnoses:  Calculus of ureter    Rx / DC Orders ED Discharge Orders          Ordered    tamsulosin (FLOMAX) 0.4 MG CAPS capsule  Daily        04/27/22 1859    ondansetron (ZOFRAN-ODT) 4 MG disintegrating tablet  Every 8 hours PRN        04/27/22 1859    HYDROcodone-acetaminophen (NORCO/VICODIN) 5-325 MG tablet  Every 6 hours  PRN        04/27/22 1859              ZLuvenia Heller PA-C 04/27/22 1904    KLeanord AsalK, DO 04/28/22 0010

## 2022-04-27 NOTE — ED Triage Notes (Signed)
Patient here POV from Home.  Endorses Lower Back Pain that began this AM. Dull but worsened recently. Worse on Left.  No Known Fevers. No Dysuria. Some nausea and Loose Stools. No Emesis.   NAD Noted during Triage. A&Ox4. GCS 15. Ambulatory.

## 2022-04-27 NOTE — Discharge Instructions (Addendum)
You were seen in the emergency department and diagnosed with a kidney stone. Based on the stone size, the stone should be able to pass on its own with pain control and symptom control for nausea.  Please make sure to follow up with your primary care provider or urology within a week to be reassessed to ensure that the stone has cleared if you haven't noticed the stone in your urine.

## 2022-04-28 ENCOUNTER — Encounter: Payer: Self-pay | Admitting: Internal Medicine

## 2022-05-05 ENCOUNTER — Ambulatory Visit (INDEPENDENT_AMBULATORY_CARE_PROVIDER_SITE_OTHER): Payer: Medicare Other | Admitting: Emergency Medicine

## 2022-05-05 ENCOUNTER — Encounter: Payer: Self-pay | Admitting: Emergency Medicine

## 2022-05-05 VITALS — BP 118/64 | HR 63 | Temp 98.0°F | Ht 65.0 in | Wt 154.0 lb

## 2022-05-05 DIAGNOSIS — N200 Calculus of kidney: Secondary | ICD-10-CM | POA: Insufficient documentation

## 2022-05-05 NOTE — Progress Notes (Signed)
Ashley Norton 66 y.o.   Chief Complaint  Patient presents with   Referral    Needs referral for urologist.    HISTORY OF PRESENT ILLNESS: This is a 66 y.o. female here for follow-up of emergency department visit on 04/27/2022 when she presented with right-sided kidney stone. Renal CT scan report reviewed with patient. Doing well today.  Asymptomatic. In the emergency department she was advised to follow-up with PCP and probably a urologist. No other complaints or medical concerns today.  HPI   Prior to Admission medications   Medication Sig Start Date End Date Taking? Authorizing Provider  ALPRAZolam (XANAX) 0.25 MG tablet Take 1 tablet (0.25 mg total) by mouth 2 (two) times daily as needed. 03/09/22  Yes Burns, Claudina Lick, MD  ascorbic acid (VITAMIN C) 1000 MG tablet Vitamin C 1,000 mg tablet   Yes [provider]  atorvastatin (LIPITOR) 10 MG tablet Take 1 tablet (10 mg total) by mouth daily. 02/04/22  Yes Burns, Claudina Lick, MD  buPROPion (WELLBUTRIN XL) 150 MG 24 hr tablet Take 1 tablet (150 mg total) by mouth at bedtime. 03/09/22  Yes Burns, Claudina Lick, MD  cefdinir (OMNICEF) 300 MG capsule Take 1 capsule (300 mg total) by mouth 2 (two) times daily. 04/13/22  Yes Burns, Claudina Lick, MD  chlorpheniramine-HYDROcodone (TUSSIONEX) 10-8 MG/5ML Take 5 mLs by mouth every 12 (twelve) hours as needed. 04/13/22  Yes Burns, Claudina Lick, MD  Coenzyme Q10 100 MG TABS Take by mouth.   Yes [provider]  fluticasone (FLONASE) 50 MCG/ACT nasal spray Place into both nostrils daily.   Yes [provider]  Loratadine (CLARITIN PO) Take by mouth.   Yes [provider]  Multiple Vitamins-Minerals (MULTI FOR HER PO) Take by mouth.   Yes [provider]  ondansetron (ZOFRAN) 8 MG tablet Take 8 mg by mouth every 8 (eight) hours as needed. 01/04/22  Yes [provider]  ondansetron (ZOFRAN-ODT) 4 MG disintegrating tablet Take 1 tablet (4 mg total) by mouth every 8  (eight) hours as needed for up to 14 days for nausea or vomiting. 04/27/22 05/11/22 Yes Lourdes Sledge A, PA-C  ondansetron (ZOFRAN-ODT) 8 MG disintegrating tablet Take 1 tablet (8 mg total) by mouth every 8 (eight) hours as needed for nausea or vomiting. 05/13/15  Yes Davonna Belling, MD  pantoprazole (PROTONIX) 40 MG tablet 1 tablet 07/24/20  Yes [provider]  RETIN-A 0.05 % cream Apply topically at bedtime. 04/19/22  Yes [provider]  scopolamine (TRANSDERM-SCOP) 1 MG/3DAYS Place 1 patch (1.5 mg total) onto the skin every 3 (three) days. 01/18/22  Yes Burns, Claudina Lick, MD  tamsulosin (FLOMAX) 0.4 MG CAPS capsule Take 1 capsule (0.4 mg total) by mouth daily for 14 days. 04/27/22 05/11/22 Yes Luvenia Heller, PA-C  Vitamin D, Ergocalciferol, (DRISDOL) 50000 units CAPS capsule Take 50,000 Units by mouth 2 (two) times a week.   Yes [provider]  Zinc 30 MG TABS Take by mouth.   Yes [provider]  zolpidem (AMBIEN) 10 MG tablet Take 5 mg by mouth at bedtime as needed for sleep.   Yes [provider]  gabapentin (NEURONTIN) 300 MG capsule Take 1 capsule (300 mg total) by mouth at bedtime. Patient not taking: Reported on 05/13/2015 06/05/12 08/05/20  [name], NP    Allergies  Allergen Reactions   Other     Other reaction(s): Severe local reaction   Crestor [Rosuvastatin] Other (See Comments)  myalgias   Penicillins Rash    Has patient had a PCN reaction causing immediate rash, facial/tongue/throat swelling, SOB or lightheadedness with hypotension: No Has patient had a PCN reaction causing severe rash involving mucus membranes or skin necrosis: No Has patient had a PCN reaction that required hospitalization No Has patient had a PCN reaction occurring within the last 10 years: No If all of the above answers are "NO", then may proceed with Cephalosporin use.    Patient Active Problem List   Diagnosis Date Noted   Acute sinus infection  04/13/2022   History of melanoma 01/18/2022   History of squamous cell carcinoma of skin 01/18/2022   Myalgia 01/18/2022   Hypercholesterolemia 01/16/2022   History of right breast cancer 06/05/2012    Past Medical History:  Diagnosis Date   Breast cancer [facility]) 2009   Right   COVID 2020   Personal history of radiation therapy 2009   Uterine cancer ([place])    At age 42    Past Surgical History:  Procedure Laterality Date   BREAST LUMPECTOMY Right 2009   CHOLECYSTECTOMY      Social History   Socioeconomic History   Marital status: Widowed    Spouse name: Not on file   Number of children: 2   Years of education: Not on file   Highest education level: Not on file  Occupational History   Not on file  Tobacco Use   Smoking status: Never   Smokeless tobacco: Never  Vaping Use   Vaping Use: Never used  Substance and Sexual Activity   Alcohol use: Yes    Comment: Occasionally   Drug use: No   Sexual activity: Not on file  Other Topics Concern   Not on file  Social History Narrative   Not on file   Social Determinants of Health   Financial Resource Strain: Low Risk  (04/22/2022)   Overall Financial Resource Strain (CARDIA)    Difficulty of Paying Living Expenses: Not hard at all  Food Insecurity: No Food Insecurity (04/22/2022)   Hunger Vital Sign    Worried About Running Out of Food in the Last Year: Never true    Ran Out of Food in the Last Year: Never true  Transportation Needs: No Transportation Needs (04/22/2022)   PRAPARE - Hydrologist (Medical): No    Lack of Transportation (Non-Medical): No  Physical Activity: Sufficiently Active (04/22/2022)   Exercise Vital Sign    Days of Exercise per Week: 5 days    Minutes of Exercise per Session: 40 min  Stress: No Stress Concern Present (04/22/2022)   [place]    Feeling of Stress : Not at all  Social Connections:  Moderately Integrated (04/22/2022)   Social Connection and Isolation Panel [NHANES]    Frequency of Communication with Friends and Family: More than three times a week    Frequency of Social Gatherings with Friends and Family: More than three times a week    Attends Religious Services: More than 4 times per year    Active Member of [employer] or Organizations: Yes    Attends Archivist Meetings: More than 4 times per year    Marital Status: Widowed  Intimate Partner Violence: Not At Risk (04/22/2022)   Humiliation, Afraid, Rape, and Kick questionnaire    Fear of Current or Ex-Partner: No    Emotionally Abused: No    Physically Abused: No  Sexually Abused: No    Family History  Problem Relation Age of Onset   Hypertension Mother    Dementia Mother    Colon cancer Father      Review of Systems  Constitutional: Negative.  Negative for chills and fever.  HENT: Negative.  Negative for congestion and sore throat.   Respiratory: Negative.  Negative for cough and shortness of breath.   Cardiovascular: Negative.  Negative for chest pain and palpitations.  Gastrointestinal:  Negative for abdominal pain, diarrhea, nausea and vomiting.  Genitourinary: Negative.   Skin: Negative.  Negative for rash.  Neurological: Negative.  Negative for dizziness and headaches.  All other systems reviewed and are negative.  Vitals:   05/05/22 0819  BP: 118/64  Pulse: 63  Temp: 98 F (36.7 C)  SpO2: 98%     Physical Exam Vitals reviewed.  Constitutional:      Appearance: Normal appearance.  HENT:     Head: Normocephalic.  Eyes:     Extraocular Movements: Extraocular movements intact.  Cardiovascular:     Rate and Rhythm: Normal rate.  Pulmonary:     Effort: Pulmonary effort is normal.  Skin:    General: Skin is warm and dry.  Neurological:     General: No focal deficit present.     Mental Status: She is alert and oriented to person, place, and time.  Psychiatric:        Mood  and Affect: Mood normal.        Behavior: Behavior normal.      ASSESSMENT & PLAN: A total of 42 minutes was spent with the patient and counseling/coordination of care regarding preparing for this visit, review of most recent office visit notes, review of most recent emergency department visit notes, review of most recent renal CT scan report, review of chronic medical conditions, review of all medications, diagnosis of kidney stones and management, prognosis, documentation, and need for follow-up.  Problem List Items Addressed This Visit       Genitourinary   Nephrolithiasis - Primary    Asymptomatic today. Renal CT scan showed no significant hydronephrosis. Punctate stone.  No infections. Advised to stay well-hydrated Pain management discussed for possible future events May need referral to urology      Patient Instructions  Kidney Stones Kidney stones are rock-like masses that form inside of the kidneys. Kidneys are organs that make pee (urine). A kidney stone may move into other parts of the urinary tract, including: The tubes that connect the kidneys to the bladder (ureters). The bladder. The tube that carries urine out of the body (urethra). Kidney stones can cause very bad pain and can block the flow of pee. The stone usually leaves your body (passes) through your pee. You may need to have a doctor take out the stone. What are the causes? Kidney stones may be caused by: A condition in which certain glands make too much parathyroid hormone (primary hyperparathyroidism). A buildup of a type of crystals in the bladder made of a chemical called uric acid. The body makes uric acid when you eat certain foods. Narrowing (stricture) of one or both of the ureters. A kidney blockage that you were born with. Past surgery on the kidney or the ureters, such as gastric bypass surgery. What increases the risk? You are more likely to develop this condition if: You have had a kidney  stone in the past. You have a family history of kidney stones. You do not drink enough water. You  eat a diet that is high in protein, salt (sodium), or sugar. You are overweight or very overweight (obese). What are the signs or symptoms? Symptoms of a kidney stone may include: Pain in the side of the belly, right below the ribs (flank pain). Pain usually spreads (radiates) to the groin. Needing to pee often or right away (urgently). Pain when going pee (urinating). Blood in your pee (hematuria). Feeling like you may vomit (nauseous). Vomiting. Fever and chills. How is this treated? Treatment depends on the size, location, and makeup of the kidney stones. The stones will often pass out of the body through peeing. You may need to: Drink more fluid to help pass the stone. In some cases, you may be given fluids through an IV tube put into one of your veins at the hospital. Take medicine for pain. Make changes in your diet to help keep kidney stones from coming back. Sometimes, medical procedures are needed to remove a kidney stone. This may involve: A procedure to break up kidney stones using a beam of light (laser) or shock waves. Surgery to remove the kidney stones. Follow these instructions at home: Medicines Take over-the-counter and prescription medicines only as told by your doctor. Ask your doctor if the medicine prescribed to you requires you to avoid driving or using heavy machinery. Eating and drinking Drink enough fluid to keep your pee pale yellow. You may be told to drink at least 8-10 glasses of water each day. This will help you pass the stone. If told by your doctor, change your diet. This may include: Limiting how much salt you eat. Eating more fruits and vegetables. Limiting how much meat, poultry, fish, and eggs you eat. Follow instructions from your doctor about eating or drinking restrictions. General instructions Collect pee samples as told by your doctor. You may  need to collect a pee sample: 24 hours after a stone comes out. 8-12 weeks after a stone comes out, and every 6-12 months after that. Strain your pee every time you pee (urinate), for as long as told. Use the strainer that your doctor recommends. Do not throw out the stone. Keep it so that it can be tested by your doctor. Keep all follow-up visits as told by your doctor. This is important. You may need follow-up tests. How is this prevented? To prevent another kidney stone: Drink enough fluid to keep your pee pale yellow. This is the best way to prevent kidney stones. Eat healthy foods. Avoid certain foods as told by your doctor. You may be told to eat less protein. Stay at a healthy weight. Where to find more information [place] ([facility]): www.kidney.[facility]): www.urologyhealth.org Contact a doctor if: You have pain that gets worse or does not get better with medicine. Get help right away if: You have a fever or chills. You get very bad pain. You get new pain in your belly (abdomen). You pass out (faint). You cannot pee. Summary Kidney stones are rock-like masses that form inside of the kidneys. Kidney stones can cause very bad pain and can block the flow of pee. The stones will often pass out of the body through peeing. Drink enough fluid to keep your pee pale yellow. This information is not intended to replace advice given to you by your health care provider. Make sure you discuss any questions you have with your health care provider. Document Revised: 12/28/2020 Document Reviewed: 12/29/2020 Elsevier Patient Education  [place].  Agustina Caroli, MD [facility]

## 2022-05-05 NOTE — Assessment & Plan Note (Signed)
Asymptomatic today. Renal CT scan showed no significant hydronephrosis. Punctate stone.  No infections. Advised to stay well-hydrated Pain management discussed for possible future events May need referral to urology

## 2022-05-05 NOTE — Patient Instructions (Signed)
Kidney Stones Kidney stones are rock-like masses that form inside of the kidneys. Kidneys are organs that make pee (urine). A kidney stone may move into other parts of the urinary tract, including: The tubes that connect the kidneys to the bladder (ureters). The bladder. The tube that carries urine out of the body (urethra). Kidney stones can cause very bad pain and can block the flow of pee. The stone usually leaves your body (passes) through your pee. You may need to have a doctor take out the stone. What are the causes? Kidney stones may be caused by: A condition in which certain glands make too much parathyroid hormone (primary hyperparathyroidism). A buildup of a type of crystals in the bladder made of a chemical called uric acid. The body makes uric acid when you eat certain foods. Narrowing (stricture) of one or both of the ureters. A kidney blockage that you were born with. Past surgery on the kidney or the ureters, such as gastric bypass surgery. What increases the risk? You are more likely to develop this condition if: You have had a kidney stone in the past. You have a family history of kidney stones. You do not drink enough water. You eat a diet that is high in protein, salt (sodium), or sugar. You are overweight or very overweight (obese). What are the signs or symptoms? Symptoms of a kidney stone may include: Pain in the side of the belly, right below the ribs (flank pain). Pain usually spreads (radiates) to the groin. Needing to pee often or right away (urgently). Pain when going pee (urinating). Blood in your pee (hematuria). Feeling like you may vomit (nauseous). Vomiting. Fever and chills. How is this treated? Treatment depends on the size, location, and makeup of the kidney stones. The stones will often pass out of the body through peeing. You may need to: Drink more fluid to help pass the stone. In some cases, you may be given fluids through an IV tube put into one  of your veins at the hospital. Take medicine for pain. Make changes in your diet to help keep kidney stones from coming back. Sometimes, medical procedures are needed to remove a kidney stone. This may involve: A procedure to break up kidney stones using a beam of light (laser) or shock waves. Surgery to remove the kidney stones. Follow these instructions at home: Medicines Take over-the-counter and prescription medicines only as told by your doctor. Ask your doctor if the medicine prescribed to you requires you to avoid driving or using heavy machinery. Eating and drinking Drink enough fluid to keep your pee pale yellow. You may be told to drink at least 8-10 glasses of water each day. This will help you pass the stone. If told by your doctor, change your diet. This may include: Limiting how much salt you eat. Eating more fruits and vegetables. Limiting how much meat, poultry, fish, and eggs you eat. Follow instructions from your doctor about eating or drinking restrictions. General instructions Collect pee samples as told by your doctor. You may need to collect a pee sample: 24 hours after a stone comes out. 8-12 weeks after a stone comes out, and every 6-12 months after that. Strain your pee every time you pee (urinate), for as long as told. Use the strainer that your doctor recommends. Do not throw out the stone. Keep it so that it can be tested by your doctor. Keep all follow-up visits as told by your doctor. This is important. You may need  follow-up tests. How is this prevented? To prevent another kidney stone: Drink enough fluid to keep your pee pale yellow. This is the best way to prevent kidney stones. Eat healthy foods. Avoid certain foods as told by your doctor. You may be told to eat less protein. Stay at a healthy weight. Where to find more information [place] ([facility]): www.kidney.[facility]): www.urologyhealth.org Contact a doctor  if: You have pain that gets worse or does not get better with medicine. Get help right away if: You have a fever or chills. You get very bad pain. You get new pain in your belly (abdomen). You pass out (faint). You cannot pee. Summary Kidney stones are rock-like masses that form inside of the kidneys. Kidney stones can cause very bad pain and can block the flow of pee. The stones will often pass out of the body through peeing. Drink enough fluid to keep your pee pale yellow. This information is not intended to replace advice given to you by your health care provider. Make sure you discuss any questions you have with your health care provider. Document Revised: 12/28/2020 Document Reviewed: 12/29/2020 Elsevier Patient Education  [place].

## 2022-05-12 ENCOUNTER — Telehealth: Payer: Self-pay | Admitting: Internal Medicine

## 2022-05-12 NOTE — Telephone Encounter (Signed)
Patient called and said that she got a bill stating that the AWV from 04/22/22 wasn't covered and she's not sure what to do because she was told by Dr Quay Burow that it needed to be done. Please advise

## 2022-05-24 ENCOUNTER — Encounter: Payer: Self-pay | Admitting: Internal Medicine

## 2022-05-24 NOTE — Telephone Encounter (Signed)
Spoke with patient today.  She is going to take a pic of denial and send to me.

## 2022-05-25 DIAGNOSIS — K080 Exfoliation of teeth due to systemic causes: Secondary | ICD-10-CM | POA: Diagnosis not present

## 2022-05-26 NOTE — Telephone Encounter (Signed)
Charge correction has removed the charge for the patient and I have spoken with the patient and made her aware.

## 2022-06-07 ENCOUNTER — Ambulatory Visit
Admission: RE | Admit: 2022-06-07 | Discharge: 2022-06-07 | Disposition: A | Discharge status: Home / Self Care | Payer: Medicare Other | Source: Ambulatory Visit | Attending: Obstetrics and Gynecology | Admitting: Obstetrics and Gynecology

## 2022-06-07 DIAGNOSIS — Z1231 Encounter for screening mammogram for malignant neoplasm of breast: Secondary | ICD-10-CM | POA: Diagnosis not present

## 2022-06-09 ENCOUNTER — Ambulatory Visit (INDEPENDENT_AMBULATORY_CARE_PROVIDER_SITE_OTHER): Payer: Medicare Other | Admitting: Emergency Medicine

## 2022-06-09 ENCOUNTER — Ambulatory Visit (INDEPENDENT_AMBULATORY_CARE_PROVIDER_SITE_OTHER): Payer: Medicare Other

## 2022-06-09 ENCOUNTER — Encounter: Payer: Self-pay | Admitting: Emergency Medicine

## 2022-06-09 VITALS — BP 118/72 | HR 71 | Temp 98.2°F | Ht 65.0 in | Wt 155.5 lb

## 2022-06-09 DIAGNOSIS — R9431 Abnormal electrocardiogram [ECG] [EKG]: Secondary | ICD-10-CM

## 2022-06-09 DIAGNOSIS — R079 Chest pain, unspecified: Secondary | ICD-10-CM | POA: Diagnosis not present

## 2022-06-09 LAB — COMPREHENSIVE METABOLIC PANEL
ALT: 24 U/L (ref 0–35)
AST: 19 U/L (ref 0–37)
Albumin: 4.3 g/dL (ref 3.5–5.2)
Alkaline Phosphatase: 74 U/L (ref 39–117)
BUN: 11 mg/dL (ref 6–23)
CO2: 30 mEq/L (ref 19–32)
Calcium: 9.3 mg/dL (ref 8.4–10.5)
Chloride: 104 mEq/L (ref 96–112)
Creatinine, Ser: 0.92 mg/dL (ref 0.40–1.20)
GFR: 64.86 mL/min (ref >60.00)
Glucose, Bld: 87 mg/dL (ref 70–99)
Potassium: 4 mEq/L (ref 3.5–5.1)
Sodium: 140 mEq/L (ref 135–145)
Total Bilirubin: 0.7 mg/dL (ref 0.2–1.2)
Total Protein: 7.3 g/dL (ref 6.0–8.3)

## 2022-06-09 LAB — CBC WITH DIFFERENTIAL/PLATELET
Basophils Absolute: 0 10*3/uL (ref 0.0–0.1)
Basophils Relative: 0.5 % (ref 0.0–3.0)
Eosinophils Absolute: 0.1 10*3/uL (ref 0.0–0.7)
Eosinophils Relative: 1.5 % (ref 0.0–5.0)
HCT: 37.6 % (ref 36.0–46.0)
Hemoglobin: 13 g/dL (ref 12.0–15.0)
Lymphocytes Relative: 32.4 % (ref 12.0–46.0)
Lymphs Abs: 1.8 10*3/uL (ref 0.7–4.0)
MCHC: 34.4 g/dL (ref 30.0–36.0)
MCV: 88.5 fl (ref 78.0–100.0)
Monocytes Absolute: 0.4 10*3/uL (ref 0.1–1.0)
Monocytes Relative: 6.5 % (ref 3.0–12.0)
Neutro Abs: 3.2 10*3/uL (ref 1.4–7.7)
Neutrophils Relative %: 59.1 % (ref 43.0–77.0)
Platelets: 268 10*3/uL (ref 150.0–400.0)
RBC: 4.25 Mil/uL (ref 3.87–5.11)
RDW: 13.4 % (ref 11.5–15.5)
WBC: 5.5 10*3/uL (ref 4.0–10.5)

## 2022-06-09 LAB — HEMOGLOBIN A1C: Hgb A1c MFr Bld: 5.4 % (ref 4.6–6.5)

## 2022-06-09 NOTE — Assessment & Plan Note (Signed)
Clinically stable.  No red flag signs or symptoms. Stable vital signs.  Unremarkable EKG. Unremarkable chest x-ray. Differential diagnosis discussed. No significant risk factors for coronary artery disease Normal cardiac CT scan from 2022 Needs cardiology evaluation. Referral placed today.

## 2022-06-09 NOTE — Patient Instructions (Signed)
Nonspecific Chest Pain Chest pain can be caused by many different conditions. Some causes of chest pain can be life-threatening. These will require treatment right away. Serious causes of chest pain include: Heart attack. A tear in the body's main blood vessel. Redness and swelling (inflammation) around your heart. Blood clot in your lungs. Other causes of chest pain may not be so serious. These include: Heartburn. Anxiety or stress. Damage to bones or muscles in your chest. Lung infections. Chest pain can feel like: Pain or discomfort in your chest. Crushing, pressure, aching, or squeezing pain. Burning or tingling. Dull or sharp pain that is worse when you move, cough, or take a deep breath. Pain or discomfort that is also felt in your back, neck, jaw, shoulder, or arm, or pain that spreads to any of these areas. It is hard to know whether your pain is caused by something that is serious or something that is not so serious. So it is important to see your doctor right away if you have chest pain. Follow these instructions at home: Medicines Take over-the-counter and prescription medicines only as told by your doctor. If you were prescribed an antibiotic medicine, take it as told by your doctor. Do not stop taking the antibiotic even if you start to feel better. Lifestyle  Rest as told by your doctor. Do not use any products that contain nicotine or tobacco, such as cigarettes, e-cigarettes, and chewing tobacco. If you need help quitting, ask your doctor. Do not drink alcohol. Make lifestyle changes as told by your doctor. These may include: Getting regular exercise. Ask your doctor what activities are safe for you. Eating a heart-healthy diet. A diet and nutrition specialist (dietitian) can help you to learn healthy eating options. Staying at a healthy weight. Treating diabetes or high blood pressure, if needed. Lowering your stress. Activities such as yoga and relaxation techniques  can help. General instructions Pay attention to any changes in your symptoms. Tell your doctor about them or any new symptoms. Avoid any activities that cause chest pain. Keep all follow-up visits as told by your doctor. This is important. You may need more testing if your chest pain does not go away. Contact a doctor if: Your chest pain does not go away. You feel depressed. You have a fever. Get help right away if: Your chest pain is worse. You have a cough that gets worse, or you cough up blood. You have very bad (severe) pain in your belly (abdomen). You pass out (faint). You have either of these for no clear reason: Sudden chest discomfort. Sudden discomfort in your arms, back, neck, or jaw. You have shortness of breath at any time. You suddenly start to sweat, or your skin gets clammy. You feel sick to your stomach (nauseous). You throw up (vomit). You suddenly feel lightheaded or dizzy. You feel very weak or tired. Your heart starts to beat fast, or it feels like it is skipping beats. These symptoms may be an emergency. Do not wait to see if the symptoms will go away. Get medical help right away. Call your local emergency services (911 in the [place] Do not drive yourself to the hospital. Summary Chest pain can be caused by many different conditions. The cause may be serious and need treatment right away. If you have chest pain, see your doctor right away. Follow your doctor's instructions for taking medicines and making lifestyle changes. Keep all follow-up visits as told by your doctor. This includes visits for any further  testing if your chest pain does not go away. Be sure to know the signs that show that your condition has become worse. Get help right away if you have these symptoms. This information is not intended to replace advice given to you by your health care provider. Make sure you discuss any questions you have with your health care provider. Document Revised:  07/10/2020 Document Reviewed: 07/10/2020 Elsevier Patient Education  [place].

## 2022-06-09 NOTE — Progress Notes (Signed)
Ashley Norton 67 y.o.   Chief Complaint  Patient presents with   Acute Visit    Left chest pain, sharp happened Friday night, patient unsure if it was indigestion, diarrhea, nausea , no appetite     HISTORY OF PRESENT ILLNESS: Acute problem visit today. This is a 67 y.o. female developed sharp left-sided chest pain last Friday followed by dull ache across her chest for the following couple days.  Also noted nausea and nonbloody diarrhea.  Yesterday had brief episode of left arm numbness.  Non-smoker.  Nondiabetic.  No history of heart disease but has history of abnormal EKG in the past.  No recent cardiology evaluation.  No echocardiogram in the past.  No history of angina.  No chest pain on exertion. No other associated symptoms. No other complaints or medical concerns today.  HPI   Prior to Admission medications   Medication Sig Start Date End Date Taking? Authorizing Provider  ALPRAZolam (XANAX) 0.25 MG tablet Take 1 tablet (0.25 mg total) by mouth 2 (two) times daily as needed. 03/09/22  Yes Burns, Claudina Lick, MD  ascorbic acid (VITAMIN C) 1000 MG tablet Vitamin C 1,000 mg tablet   Yes [provider]  atorvastatin (LIPITOR) 10 MG tablet Take 1 tablet (10 mg total) by mouth daily. 02/04/22  Yes Burns, Claudina Lick, MD  buPROPion (WELLBUTRIN XL) 150 MG 24 hr tablet Take 1 tablet (150 mg total) by mouth at bedtime. 03/09/22  Yes Burns, Claudina Lick, MD  cefdinir (OMNICEF) 300 MG capsule Take 1 capsule (300 mg total) by mouth 2 (two) times daily. 04/13/22  Yes Burns, Claudina Lick, MD  chlorpheniramine-HYDROcodone (TUSSIONEX) 10-8 MG/5ML Take 5 mLs by mouth every 12 (twelve) hours as needed. 04/13/22  Yes Burns, Claudina Lick, MD  Coenzyme Q10 100 MG TABS Take by mouth.   Yes [provider]  fluticasone (FLONASE) 50 MCG/ACT nasal spray Place into both nostrils daily.   Yes [provider]  Loratadine (CLARITIN PO) Take by mouth.   Yes [provider]  Multiple  Vitamins-Minerals (MULTI FOR HER PO) Take by mouth.   Yes [provider]  ondansetron (ZOFRAN) 8 MG tablet Take 8 mg by mouth every 8 (eight) hours as needed. 01/04/22  Yes [provider]  ondansetron (ZOFRAN-ODT) 8 MG disintegrating tablet Take 1 tablet (8 mg total) by mouth every 8 (eight) hours as needed for nausea or vomiting. 05/13/15  Yes Davonna Belling, MD  pantoprazole (PROTONIX) 40 MG tablet 1 tablet 07/24/20  Yes [provider]  RETIN-A 0.05 % cream Apply topically at bedtime. 04/19/22  Yes [provider]  scopolamine (TRANSDERM-SCOP) 1 MG/3DAYS Place 1 patch (1.5 mg total) onto the skin every 3 (three) days. 01/18/22  Yes Burns, Claudina Lick, MD  Vitamin D, Ergocalciferol, (DRISDOL) 50000 units CAPS capsule Take 50,000 Units by mouth 2 (two) times a week.   Yes [provider]  Zinc 30 MG TABS Take by mouth.   Yes [provider]  zolpidem (AMBIEN) 10 MG tablet Take 5 mg by mouth at bedtime as needed for sleep.   Yes [provider]  gabapentin (NEURONTIN) 300 MG capsule Take 1 capsule (300 mg total) by mouth at bedtime. Patient not taking: Reported on 05/13/2015 06/05/12 08/05/20  [name], NP    Allergies  Allergen Reactions   Other     Other reaction(s): Severe local reaction   Crestor [Rosuvastatin] Other (See Comments)    myalgias   Penicillins Rash  Has patient had a PCN reaction causing immediate rash, facial/tongue/throat swelling, SOB or lightheadedness with hypotension: No Has patient had a PCN reaction causing severe rash involving mucus membranes or skin necrosis: No Has patient had a PCN reaction that required hospitalization No Has patient had a PCN reaction occurring within the last 10 years: No If all of the above answers are "NO", then may proceed with Cephalosporin use.    Patient Active Problem List   Diagnosis Date Noted   Nephrolithiasis 05/05/2022   History of melanoma 01/18/2022    History of squamous cell carcinoma of skin 01/18/2022   Hypercholesterolemia 01/16/2022   History of right breast cancer 06/05/2012    Past Medical History:  Diagnosis Date   Breast cancer [facility]) 2009   Right   COVID 2020   Personal history of radiation therapy 2009   Uterine cancer ([place])    At age 45    Past Surgical History:  Procedure Laterality Date   BREAST LUMPECTOMY Right 2009   CHOLECYSTECTOMY      Social History   Socioeconomic History   Marital status: Widowed    Spouse name: Not on file   Number of children: 2   Years of education: Not on file   Highest education level: Not on file  Occupational History   Not on file  Tobacco Use   Smoking status: Never   Smokeless tobacco: Never  Vaping Use   Vaping Use: Never used  Substance and Sexual Activity   Alcohol use: Yes    Comment: Occasionally   Drug use: No   Sexual activity: Not on file  Other Topics Concern   Not on file  Social History Narrative   Not on file   Social Determinants of Health   Financial Resource Strain: Low Risk  (04/22/2022)   Overall Financial Resource Strain (CARDIA)    Difficulty of Paying Living Expenses: Not hard at all  Food Insecurity: No Food Insecurity (04/22/2022)   Hunger Vital Sign    Worried About Running Out of Food in the Last Year: Never true    Ran Out of Food in the Last Year: Never true  Transportation Needs: No Transportation Needs (04/22/2022)   PRAPARE - Hydrologist (Medical): No    Lack of Transportation (Non-Medical): No  Physical Activity: Sufficiently Active (04/22/2022)   Exercise Vital Sign    Days of Exercise per Week: 5 days    Minutes of Exercise per Session: 40 min  Stress: No Stress Concern Present (04/22/2022)   [place]    Feeling of Stress : Not at all  Social Connections: Moderately Integrated (04/22/2022)   Social Connection and Isolation  Panel [NHANES]    Frequency of Communication with Friends and Family: More than three times a week    Frequency of Social Gatherings with Friends and Family: More than three times a week    Attends Religious Services: More than 4 times per year    Active Member of [employer] or Organizations: Yes    Attends Archivist Meetings: More than 4 times per year    Marital Status: Widowed  Intimate Partner Violence: Not At Risk (04/22/2022)   Humiliation, Afraid, Rape, and Kick questionnaire    Fear of Current or Ex-Partner: No    Emotionally Abused: No    Physically Abused: No    Sexually Abused: No    Family History  Problem Relation Age of  Onset   Hypertension Mother    Dementia Mother    Colon cancer Father      Review of Systems  Constitutional: Negative.  Negative for chills and fever.  HENT: Negative.  Negative for congestion and sore throat.   Respiratory: Negative.  Negative for cough, hemoptysis and shortness of breath.   Cardiovascular:  Positive for chest pain. Negative for palpitations and leg swelling.  Gastrointestinal:  Positive for diarrhea and nausea. Negative for abdominal pain, blood in stool, heartburn, melena and vomiting.  Genitourinary: Negative.  Negative for dysuria and frequency.  Musculoskeletal: Negative.   Skin: Negative.  Negative for rash.  Neurological: Negative.  Negative for dizziness and headaches.  All other systems reviewed and are negative.   Today's Vitals   06/09/22 0800  BP: 118/72  Pulse: 71  Temp: 98.2 F (36.8 C)  TempSrc: Oral  SpO2: 95%  Weight: 155 lb 8 oz (70.5 kg)  Height: '5\' 5"'$  (1.651 m)   Body mass index is 25.88 kg/m. Wt Readings from Last 3 Encounters:  06/09/22 155 lb 8 oz (70.5 kg)  05/05/22 154 lb (69.9 kg)  04/27/22 154 lb 1.6 oz (69.9 kg)    Physical Exam Vitals reviewed.  Constitutional:      Appearance: Normal appearance.  HENT:     Head: Normocephalic.  Eyes:     Extraocular Movements: Extraocular  movements intact.     Pupils: Pupils are equal, round, and reactive to light.  Cardiovascular:     Rate and Rhythm: Normal rate and regular rhythm.     Pulses: Normal pulses.     Heart sounds: Normal heart sounds.  Pulmonary:     Effort: Pulmonary effort is normal.     Breath sounds: Normal breath sounds.  Abdominal:     Palpations: Abdomen is soft.     Tenderness: There is no abdominal tenderness.  Musculoskeletal:     Cervical back: No tenderness.  Lymphadenopathy:     Cervical: No cervical adenopathy.  Skin:    General: Skin is warm and dry.  Neurological:     General: No focal deficit present.     Mental Status: She is alert and oriented to person, place, and time.  Psychiatric:        Mood and Affect: Mood normal.        Behavior: Behavior normal.    EKG: Normal sinus rhythm with ventricular rate of 71.  No acute ischemic changes.  Nonspecific changes. DG Chest 2 View  Result Date: 06/09/2022 CLINICAL DATA:  Nonspecific chest pain, history of breast cancer EXAM: CHEST - 2 VIEW COMPARISON:  05/13/2015 FINDINGS: Frontal and lateral views of the chest demonstrate an unremarkable cardiac silhouette. No airspace disease, effusion, or pneumothorax. No acute bony abnormality. IMPRESSION: 1. No acute intrathoracic process. Electronically Signed   By: Randa Ngo M.D.   On: 06/09/2022 08:45    ASSESSMENT & PLAN: A total of 45 minutes was spent with the patient and counseling/coordination of care regarding preparing for this visit, review of most recent office visit notes, review of most recent blood work results, review of today's chest x-ray report, differential diagnosis of chest pain and need for cardiology evaluation, prognosis, documentation, and need for follow-up.  Problem List Items Addressed This Visit       Other   Chest pain - Primary    Clinically stable.  No red flag signs or symptoms. Stable vital signs.  Unremarkable EKG. Unremarkable chest x-ray. Differential  diagnosis discussed. No  significant risk factors for coronary artery disease Normal cardiac CT scan from 2022 Needs cardiology evaluation. Referral placed today.      Relevant Orders   EKG 12-Lead   Ambulatory referral to Cardiology   DG Chest 2 View (Completed)   Comprehensive metabolic panel   CBC with Differential/Platelet   Hemoglobin A1c   Other Visit Diagnoses     Abnormal EKG       Relevant Orders   Ambulatory referral to Cardiology   DG Chest 2 View (Completed)   Comprehensive metabolic panel   CBC with Differential/Platelet   Hemoglobin A1c      Patient Instructions  Nonspecific Chest Pain Chest pain can be caused by many different conditions. Some causes of chest pain can be life-threatening. These will require treatment right away. Serious causes of chest pain include: Heart attack. A tear in the body's main blood vessel. Redness and swelling (inflammation) around your heart. Blood clot in your lungs. Other causes of chest pain may not be so serious. These include: Heartburn. Anxiety or stress. Damage to bones or muscles in your chest. Lung infections. Chest pain can feel like: Pain or discomfort in your chest. Crushing, pressure, aching, or squeezing pain. Burning or tingling. Dull or sharp pain that is worse when you move, cough, or take a deep breath. Pain or discomfort that is also felt in your back, neck, jaw, shoulder, or arm, or pain that spreads to any of these areas. It is hard to know whether your pain is caused by something that is serious or something that is not so serious. So it is important to see your doctor right away if you have chest pain. Follow these instructions at home: Medicines Take over-the-counter and prescription medicines only as told by your doctor. If you were prescribed an antibiotic medicine, take it as told by your doctor. Do not stop taking the antibiotic even if you start to feel better. Lifestyle  Rest as told by your  doctor. Do not use any products that contain nicotine or tobacco, such as cigarettes, e-cigarettes, and chewing tobacco. If you need help quitting, ask your doctor. Do not drink alcohol. Make lifestyle changes as told by your doctor. These may include: Getting regular exercise. Ask your doctor what activities are safe for you. Eating a heart-healthy diet. A diet and nutrition specialist (dietitian) can help you to learn healthy eating options. Staying at a healthy weight. Treating diabetes or high blood pressure, if needed. Lowering your stress. Activities such as yoga and relaxation techniques can help. General instructions Pay attention to any changes in your symptoms. Tell your doctor about them or any new symptoms. Avoid any activities that cause chest pain. Keep all follow-up visits as told by your doctor. This is important. You may need more testing if your chest pain does not go away. Contact a doctor if: Your chest pain does not go away. You feel depressed. You have a fever. Get help right away if: Your chest pain is worse. You have a cough that gets worse, or you cough up blood. You have very bad (severe) pain in your belly (abdomen). You pass out (faint). You have either of these for no clear reason: Sudden chest discomfort. Sudden discomfort in your arms, back, neck, or jaw. You have shortness of breath at any time. You suddenly start to sweat, or your skin gets clammy. You feel sick to your stomach (nauseous). You throw up (vomit). You suddenly feel lightheaded or dizzy. You feel very  weak or tired. Your heart starts to beat fast, or it feels like it is skipping beats. These symptoms may be an emergency. Do not wait to see if the symptoms will go away. Get medical help right away. Call your local emergency services (911 in the [place] Do not drive yourself to the hospital. Summary Chest pain can be caused by many different conditions. The cause may be serious and need  treatment right away. If you have chest pain, see your doctor right away. Follow your doctor's instructions for taking medicines and making lifestyle changes. Keep all follow-up visits as told by your doctor. This includes visits for any further testing if your chest pain does not go away. Be sure to know the signs that show that your condition has become worse. Get help right away if you have these symptoms. This information is not intended to replace advice given to you by your health care provider. Make sure you discuss any questions you have with your health care provider. Document Revised: 07/10/2020 Document Reviewed: 07/10/2020 Elsevier Patient Education  [place], MD [facility]

## 2022-06-10 ENCOUNTER — Ambulatory Visit: Payer: Medicare Other | Admitting: Internal Medicine

## 2022-06-10 ENCOUNTER — Ambulatory Visit: Payer: Medicare Other | Attending: Internal Medicine | Admitting: Internal Medicine

## 2022-06-10 ENCOUNTER — Encounter: Payer: Self-pay | Admitting: Internal Medicine

## 2022-06-10 VITALS — BP 124/78 | HR 72 | Ht 65.0 in | Wt 156.6 lb

## 2022-06-10 DIAGNOSIS — R079 Chest pain, unspecified: Secondary | ICD-10-CM

## 2022-06-10 NOTE — Patient Instructions (Signed)
Medication Instructions:  No changes *If you need a refill on your cardiac medications before your next appointment, please call your pharmacy*  Follow-Up: At [facility], you and your health needs are our priority.  As part of our continuing mission to provide you with exceptional heart care, we have created designated Provider Care Teams.  These Care Teams include your primary Cardiologist (physician) and Advanced Practice Providers (APPs -  Physician Assistants and Nurse Practitioners) who all work together to provide you with the care you need, when you need it.  We recommend signing up for the patient portal called "MyChart".  Sign up information is provided on this After Visit Summary.  MyChart is used to connect with patients for Virtual Visits (Telemedicine).  Patients are able to view lab/test results, encounter notes, upcoming appointments, etc.  Non-urgent messages can be sent to your provider as well.   To learn more about what you can do with MyChart, go to [email or web address].    Your next appointment:    Follow up as needed  Provider:   Dr Harl Bowie

## 2022-06-10 NOTE — Progress Notes (Signed)
Cardiology Office Note:    Date:  06/10/2022   ID:  Ashley Norton, DOB Feb 24, 1956, MRN [id number]  PCP:  Binnie Rail, MD   [place] Providers Cardiologist:  None     Referring MD: Binnie Rail, MD   No chief complaint on file. CP  History of Present Illness:    Ashley Norton is a 67 y.o. female with a hx of R breast cancer treated with s/p lumpectomy and 25 XRT treatments and took anti estrogen for 5 years, uterine cancer at age 15, s/p hysterectomy, reports chest pain. She had sharp chest pain after eating ice cream one night. She took some pepcid and took tums. She notes leg and arm pain and numbness. No prior events. Non smoker. No CAD hx. 01/05/2021-CAC 0  Past Medical History:  Diagnosis Date   Breast cancer ([place]) 2009   Right   COVID 2020   Personal history of radiation therapy 2009   Uterine cancer ([place])    At age 24    Past Surgical History:  Procedure Laterality Date   BREAST LUMPECTOMY Right 2009   CHOLECYSTECTOMY      Current Medications: Current Outpatient Medications on File Prior to Visit  Medication Sig Dispense Refill   ALPRAZolam (XANAX) 0.25 MG tablet Take 1 tablet (0.25 mg total) by mouth 2 (two) times daily as needed. 30 tablet 2   ascorbic acid (VITAMIN C) 1000 MG tablet Vitamin C 1,000 mg tablet     atorvastatin (LIPITOR) 10 MG tablet Take 1 tablet (10 mg total) by mouth daily. 90 tablet 3   buPROPion (WELLBUTRIN XL) 150 MG 24 hr tablet Take 1 tablet (150 mg total) by mouth at bedtime. 90 tablet 3   cefdinir (OMNICEF) 300 MG capsule Take 1 capsule (300 mg total) by mouth 2 (two) times daily. 20 capsule 0   chlorpheniramine-HYDROcodone (TUSSIONEX) 10-8 MG/5ML Take 5 mLs by mouth every 12 (twelve) hours as needed. 115 mL 0   Coenzyme Q10 100 MG TABS Take by mouth.     fluticasone (FLONASE) 50 MCG/ACT nasal spray Place into both nostrils daily.     Loratadine (CLARITIN PO) Take by mouth.     Multiple Vitamins-Minerals (MULTI  FOR HER PO) Take by mouth.     ondansetron (ZOFRAN) 8 MG tablet Take 8 mg by mouth every 8 (eight) hours as needed.     ondansetron (ZOFRAN-ODT) 8 MG disintegrating tablet Take 1 tablet (8 mg total) by mouth every 8 (eight) hours as needed for nausea or vomiting. 10 tablet 0   pantoprazole (PROTONIX) 40 MG tablet 1 tablet     RETIN-A 0.05 % cream Apply topically at bedtime.     scopolamine (TRANSDERM-SCOP) 1 MG/3DAYS Place 1 patch (1.5 mg total) onto the skin every 3 (three) days. 10 patch 0   Vitamin D, Ergocalciferol, (DRISDOL) 50000 units CAPS capsule Take 50,000 Units by mouth 2 (two) times a week.     Zinc 30 MG TABS Take by mouth.     zolpidem (AMBIEN) 10 MG tablet Take 5 mg by mouth at bedtime as needed for sleep.     [DISCONTINUED] gabapentin (NEURONTIN) 300 MG capsule Take 1 capsule (300 mg total) by mouth at bedtime. (Patient not taking: Reported on 05/13/2015) 90 capsule 4   No current facility-administered medications on file prior to visit.     Allergies:   Other, Crestor [rosuvastatin], and Penicillins   Social History   Socioeconomic History   Marital status:  Widowed    Spouse name: Not on file   Number of children: 2   Years of education: Not on file   Highest education level: Not on file  Occupational History   Not on file  Tobacco Use   Smoking status: Never   Smokeless tobacco: Never  Vaping Use   Vaping Use: Never used  Substance and Sexual Activity   Alcohol use: Yes    Comment: Occasionally   Drug use: No   Sexual activity: Not on file  Other Topics Concern   Not on file  Social History Narrative   Not on file   Social Determinants of Health   Financial Resource Strain: Low Risk  (04/22/2022)   Overall Financial Resource Strain (CARDIA)    Difficulty of Paying Living Expenses: Not hard at all  Food Insecurity: No Food Insecurity (04/22/2022)   Hunger Vital Sign    Worried About Running Out of Food in the Last Year: Never true    Ran Out of Food in  the Last Year: Never true  Transportation Needs: No Transportation Needs (04/22/2022)   PRAPARE - Hydrologist (Medical): No    Lack of Transportation (Non-Medical): No  Physical Activity: Sufficiently Active (04/22/2022)   Exercise Vital Sign    Days of Exercise per Week: 5 days    Minutes of Exercise per Session: 40 min  Stress: No Stress Concern Present (04/22/2022)   [place]    Feeling of Stress : Not at all  Social Connections: Moderately Integrated (04/22/2022)   Social Connection and Isolation Panel [NHANES]    Frequency of Communication with Friends and Family: More than three times a week    Frequency of Social Gatherings with Friends and Family: More than three times a week    Attends Religious Services: More than 4 times per year    Active Member of [employer] or Organizations: Yes    Attends Archivist Meetings: More than 4 times per year    Marital Status: Widowed     Family History: The patient's family history includes Colon cancer in her father; Dementia in her mother; Hypertension in her mother.  ROS:   Please see the history of present illness.     All other systems reviewed and are negative.  EKGs/Labs/Other Studies Reviewed:    The following studies were reviewed today:   EKG:  EKG is  ordered today.  The ekg ordered today demonstrates   06/10/2022- NSR  Recent Labs: 06/09/2022: ALT 24; BUN 11; Creatinine, Ser 0.92; Hemoglobin 13.0; Platelets 268.0; Potassium 4.0; Sodium 140   Recent Lipid Panel    Component Value Date/Time   CHOL 160 03/10/2022 1131   TRIG 124.0 03/10/2022 1131   HDL 52.10 03/10/2022 1131   CHOLHDL 3 03/10/2022 1131   VLDL 24.8 03/10/2022 1131   LDLCALC 83 03/10/2022 1131     Risk Assessment/Calculations:     Physical Exam:    VS:   Vitals:   06/10/22 1323  BP: 124/78  Pulse: 72  SpO2: 96%    Wt Readings from Last 3  Encounters:  06/10/22 156 lb 9.6 oz (71 kg)  06/09/22 155 lb 8 oz (70.5 kg)  05/05/22 154 lb (69.9 kg)     GEN:  Well nourished, well developed in no acute distress HEENT: Normal NECK: No JVD; No carotid bruits LYMPHATICS: No lymphadenopathy CARDIAC: RRR, no murmurs, rubs, gallops RESPIRATORY:  Clear to  auscultation without rales, wheezing or rhonchi  ABDOMEN: Soft, non-tender, non-distended MUSCULOSKELETAL:  No edema; No deformity  SKIN: Warm and dry NEUROLOGIC:  Alert and oriented x 3 PSYCHIATRIC:  Normal affect   ASSESSMENT:   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 Beyond age, she does not have significant CVD risk factors.   PLAN:    In order of problems listed above:  Follow up PRN     Medication Adjustments/Labs and Tests Ordered: Current medicines are reviewed at length with the patient today.  Concerns regarding medicines are outlined above.  Orders Placed This Encounter  Procedures   EKG 12-Lead   No orders of the defined types were placed in this encounter.   Patient Instructions  Medication Instructions:  No changes *If you need a refill on your cardiac medications before your next appointment, please call your pharmacy*  Follow-Up: At [facility], you and your health needs are our priority.  As part of our continuing mission to provide you with exceptional heart care, we have created designated Provider Care Teams.  These Care Teams include your primary Cardiologist (physician) and Advanced Practice Providers (APPs -  Physician Assistants and Nurse Practitioners) who all work together to provide you with the care you need, when you need it.  We recommend signing up for the patient portal called "MyChart".  Sign up information is provided on this After Visit Summary.  MyChart is used to connect with patients for Virtual Visits (Telemedicine).  Patients are able to view lab/test results, encounter notes, upcoming appointments, etc.  Non-urgent  messages can be sent to your provider as well.   To learn more about what you can do with MyChart, go to [email or web address].    Your next appointment:    Follow up as needed  Provider:   Dr Harl Bowie     Signed, Janina Mayo, MD  06/10/2022 2:03 PM    [place]

## 2022-06-18 ENCOUNTER — Other Ambulatory Visit: Payer: Self-pay | Admitting: Internal Medicine

## 2022-06-22 DIAGNOSIS — L928 Other granulomatous disorders of the skin and subcutaneous tissue: Secondary | ICD-10-CM | POA: Diagnosis not present

## 2022-07-15 ENCOUNTER — Other Ambulatory Visit: Payer: Self-pay | Admitting: Internal Medicine

## 2022-07-17 ENCOUNTER — Other Ambulatory Visit: Payer: Self-pay

## 2022-07-17 MED ORDER — ZOLPIDEM TARTRATE 10 MG PO TABS
5.0000 mg | ORAL_TABLET | Freq: Every evening | ORAL | 0 refills | Status: DC | PRN
  Filled 2022-07-17: qty 30, 60d supply, fill #0

## 2022-07-18 ENCOUNTER — Other Ambulatory Visit: Payer: Self-pay

## 2022-07-19 ENCOUNTER — Other Ambulatory Visit: Payer: Self-pay

## 2022-07-30 DIAGNOSIS — H524 Presbyopia: Secondary | ICD-10-CM | POA: Diagnosis not present

## 2022-08-02 DIAGNOSIS — D485 Neoplasm of uncertain behavior of skin: Secondary | ICD-10-CM | POA: Diagnosis not present

## 2022-08-02 DIAGNOSIS — L820 Inflamed seborrheic keratosis: Secondary | ICD-10-CM | POA: Diagnosis not present

## 2022-08-18 DIAGNOSIS — M79642 Pain in left hand: Secondary | ICD-10-CM | POA: Diagnosis not present

## 2022-08-18 DIAGNOSIS — L608 Other nail disorders: Secondary | ICD-10-CM | POA: Diagnosis not present

## 2022-09-01 DIAGNOSIS — K080 Exfoliation of teeth due to systemic causes: Secondary | ICD-10-CM | POA: Diagnosis not present

## 2022-09-27 ENCOUNTER — Encounter: Payer: Self-pay | Admitting: Internal Medicine

## 2022-09-27 NOTE — Progress Notes (Unsigned)
    Subjective:    Patient ID: Ashley Norton, female    DOB: 03-Feb-1956, 67 y.o.   MRN: [id number]      HPI [place] is here for No chief complaint on file.   Atorvastatin - muscle pain/joint pain   CAC score 12/2020 - 0  Medications and allergies reviewed with patient and updated if appropriate.  Current Outpatient Medications on File Prior to Visit  Medication Sig Dispense Refill   ALPRAZolam (XANAX) 0.25 MG tablet TAKE 1 TABLET BY MOUTH TWICE A DAY AS NEEDED 30 tablet 5   ascorbic acid (VITAMIN C) 1000 MG tablet Vitamin C 1,000 mg tablet     atorvastatin (LIPITOR) 10 MG tablet Take 1 tablet (10 mg total) by mouth daily. 90 tablet 3   buPROPion (WELLBUTRIN XL) 150 MG 24 hr tablet Take 1 tablet (150 mg total) by mouth at bedtime. 90 tablet 3   cefdinir (OMNICEF) 300 MG capsule Take 1 capsule (300 mg total) by mouth 2 (two) times daily. 20 capsule 0   chlorpheniramine-HYDROcodone (TUSSIONEX) 10-8 MG/5ML Take 5 mLs by mouth every 12 (twelve) hours as needed. 115 mL 0   Coenzyme Q10 100 MG TABS Take by mouth.     fluticasone (FLONASE) 50 MCG/ACT nasal spray Place into both nostrils daily.     Loratadine (CLARITIN PO) Take by mouth.     Multiple Vitamins-Minerals (MULTI FOR HER PO) Take by mouth.     ondansetron (ZOFRAN) 8 MG tablet Take 8 mg by mouth every 8 (eight) hours as needed.     ondansetron (ZOFRAN-ODT) 8 MG disintegrating tablet Take 1 tablet (8 mg total) by mouth every 8 (eight) hours as needed for nausea or vomiting. 10 tablet 0   pantoprazole (PROTONIX) 40 MG tablet 1 tablet     RETIN-A 0.05 % cream Apply topically at bedtime.     scopolamine (TRANSDERM-SCOP) 1 MG/3DAYS Place 1 patch (1.5 mg total) onto the skin every 3 (three) days. 10 patch 0   Vitamin D, Ergocalciferol, (DRISDOL) 50000 units CAPS capsule Take 50,000 Units by mouth 2 (two) times a week.     Zinc 30 MG TABS Take by mouth.     zolpidem (AMBIEN) 10 MG tablet Take 0.5 tablets (5 mg total) by mouth at  bedtime as needed for sleep. 30 tablet 0   [DISCONTINUED] gabapentin (NEURONTIN) 300 MG capsule Take 1 capsule (300 mg total) by mouth at bedtime. (Patient not taking: Reported on 05/13/2015) 90 capsule 4   No current facility-administered medications on file prior to visit.    Review of Systems     Objective:  There were no vitals filed for this visit. BP Readings from Last 3 Encounters:  06/10/22 124/78  06/09/22 118/72  05/05/22 118/64   Wt Readings from Last 3 Encounters:  06/10/22 156 lb 9.6 oz (71 kg)  06/09/22 155 lb 8 oz (70.5 kg)  05/05/22 154 lb (69.9 kg)   There is no height or weight on file to calculate BMI.    Physical Exam         Assessment & Plan:    See Problem List for Assessment and Plan of chronic medical problems.

## 2022-09-27 NOTE — Patient Instructions (Signed)
      Blood work was ordered.   The lab is on the first floor.    Medications changes include :       A referral was ordered and someone will call you to schedule an appointment.     No follow-ups on file.

## 2022-09-28 ENCOUNTER — Ambulatory Visit (INDEPENDENT_AMBULATORY_CARE_PROVIDER_SITE_OTHER): Payer: Medicare Other | Admitting: Internal Medicine

## 2022-09-28 VITALS — BP 124/76 | HR 67 | Temp 98.4°F | Ht 65.0 in | Wt 157.0 lb

## 2022-09-28 DIAGNOSIS — E7800 Pure hypercholesterolemia, unspecified: Secondary | ICD-10-CM

## 2022-09-28 DIAGNOSIS — Z8542 Personal history of malignant neoplasm of other parts of uterus: Secondary | ICD-10-CM | POA: Diagnosis not present

## 2022-09-28 DIAGNOSIS — R911 Solitary pulmonary nodule: Secondary | ICD-10-CM | POA: Insufficient documentation

## 2022-09-28 MED ORDER — ZOLPIDEM TARTRATE 10 MG PO TABS: 5.0000 mg | ORAL_TABLET | Freq: Every evening | ORAL | 0 refills | Status: DC | PRN

## 2022-09-28 NOTE — Assessment & Plan Note (Signed)
CT scan that she had-coronary artery calcium score test in 2022 showed scattered very small pulmonary nodules - Small scattered subpleural nodules in the right middle lobe and lingula, likely post inflammatory measuring up to 5 mm She does have a history of breast cancer and uterine cancer Will evaluate further with follow-up CT scan

## 2022-09-28 NOTE — Assessment & Plan Note (Signed)
Chronic Was on Crestor twice-even on 5 mg dose and it caused myalgias-symptoms resolved after stopping medication Currently on atorvastatin 10 mg 3 times daily and having muscle and bone pain Stop medication-expect symptoms to improve and hopefully resolve Ashley Norton will go on her trip and after Ashley Norton returns we will recheck her cholesterol off medication-depending on levels can consider low-dose pravastatin If this is causing side effects can consider Zetia Coronary calcium score is 0 so Ashley Norton is low risk Continue regular exercise, healthy diet

## 2022-10-21 DIAGNOSIS — M25571 Pain in right ankle and joints of right foot: Secondary | ICD-10-CM | POA: Diagnosis not present

## 2022-10-21 DIAGNOSIS — M79671 Pain in right foot: Secondary | ICD-10-CM | POA: Diagnosis not present

## 2022-11-06 ENCOUNTER — Ambulatory Visit
Admission: EM | Admit: 2022-11-06 | Discharge: 2022-11-06 | Disposition: A | Discharge status: Home / Self Care | Payer: Medicare Other | Attending: Nurse Practitioner | Admitting: Nurse Practitioner

## 2022-11-06 DIAGNOSIS — Z20822 Contact with and (suspected) exposure to covid-19: Secondary | ICD-10-CM | POA: Insufficient documentation

## 2022-11-06 DIAGNOSIS — J0100 Acute maxillary sinusitis, unspecified: Secondary | ICD-10-CM | POA: Diagnosis not present

## 2022-11-06 DIAGNOSIS — J209 Acute bronchitis, unspecified: Secondary | ICD-10-CM | POA: Diagnosis not present

## 2022-11-06 MED ORDER — FLUTICASONE PROPIONATE 50 MCG/ACT NA SUSP: 1.0000 | Freq: Every day | NASAL | 0 refills | Status: AC

## 2022-11-06 MED ORDER — PROMETHAZINE-DM 6.25-15 MG/5ML PO SYRP: 2.5000 mL | ORAL_SOLUTION | Freq: Four times a day (QID) | ORAL | 0 refills | Status: DC | PRN

## 2022-11-06 MED ORDER — AZITHROMYCIN 250 MG PO TABS: 250.0000 mg | ORAL_TABLET | Freq: Every day | ORAL | 0 refills | Status: DC

## 2022-11-06 NOTE — Discharge Instructions (Addendum)
Start Zithromax as prescribed.  You may take Promethazine DM as needed for cough.  Please of this medication will make you drowsy.  Do not drink alcohol or drive while you are on this medication.  Flonase daily and do nasal rinses as tolerated.  You may continue over-the-counter decongestants if needed.  Please follow-up with your PCP if your symptoms do not improve.  Please go to the emergency room for any worsening symptoms.  I hope you feel better soon!

## 2022-11-06 NOTE — ED Provider Notes (Addendum)
[facility]    CSN: [id number] Arrival date & time: 11/06/22  1157      History   Chief Complaint Chief Complaint  Patient presents with   Nasal Congestion    HPI Ashley Norton is a 67 y.o. female  presents for evaluation of URI symptoms for 6-7 days. Patient reports associated symptoms of cough, congestion with sinus pressure/pain. Denies N/V/D, fevers, body aches, shortness of breath. Patient does not have a hx of asthma or smoking.  She did daily go on a cruise and has found out since returning that 2 of her travel mates have tested positive for COVID.  She has not done a home test yet.  She is vaccinated for COVID.  States she has had COVID in the past without hospitalizations/complications.  Pt has taken decongestant OTC for symptoms. Pt has no other concerns at this time.   HPI  Past Medical History:  Diagnosis Date   Breast cancer (HCC) 2009   Right   COVID 2020   Personal history of radiation therapy 2009   Uterine cancer [facility])    At age 26    Patient Active Problem List   Diagnosis Date Noted   Lung nodule 09/28/2022   History of uterine cancer 09/28/2022   Nephrolithiasis 05/05/2022   History of melanoma 01/18/2022   History of squamous cell carcinoma of skin 01/18/2022   Hypercholesterolemia 01/16/2022   History of right breast cancer 06/05/2012    Past Surgical History:  Procedure Laterality Date   BREAST LUMPECTOMY Right 2009   CHOLECYSTECTOMY      OB History   No obstetric history on file.      Home Medications    Prior to Admission medications   Medication Sig Start Date End Date Taking? Authorizing Provider  ALPRAZolam (XANAX) 0.25 MG tablet TAKE 1 TABLET BY MOUTH TWICE A DAY AS NEEDED 06/19/22  Yes Burns, Bobette Mo, MD  ascorbic acid (VITAMIN C) 1000 MG tablet Vitamin C 1,000 mg tablet   Yes [provider]  azithromycin (ZITHROMAX) 250 MG tablet Take 1 tablet (250 mg total) by mouth daily. Take first 2 tablets  together, then 1 every day until finished. 11/06/22  Yes Radford Pax, NP  buPROPion (WELLBUTRIN XL) 150 MG 24 hr tablet Take 1 tablet (150 mg total) by mouth at bedtime. 03/09/22  Yes Burns, Bobette Mo, MD  fluticasone (FLONASE) 50 MCG/ACT nasal spray Place 1 spray into both nostrils daily. 11/06/22  Yes Radford Pax, NP  Loratadine (CLARITIN PO) Take by mouth.   Yes [provider]  Multiple Vitamins-Minerals (MULTI FOR HER PO) Take by mouth.   Yes [provider]  ondansetron (ZOFRAN) 8 MG tablet Take 8 mg by mouth every 8 (eight) hours as needed. 01/04/22  Yes [provider]  ondansetron (ZOFRAN-ODT) 8 MG disintegrating tablet Take 1 tablet (8 mg total) by mouth every 8 (eight) hours as needed for nausea or vomiting. 05/13/15  Yes Benjiman Core, MD  pantoprazole (PROTONIX) 40 MG tablet 1 tablet 07/24/20  Yes [provider]  promethazine-dextromethorphan (PROMETHAZINE-DM) 6.25-15 MG/5ML syrup Take 2.5 mLs by mouth 4 (four) times daily as needed for cough. 11/06/22  Yes Radford Pax, NP  RETIN-A 0.05 % cream Apply topically at bedtime. 04/19/22  Yes [provider]  Vitamin D, Ergocalciferol, (DRISDOL) 50000 units CAPS capsule Take 50,000 Units by mouth 2 (two) times a week.   Yes [provider]  Zinc 30 MG TABS Take  by mouth.   Yes [provider]  zolpidem (AMBIEN) 10 MG tablet Take 0.5 tablets (5 mg total) by mouth at bedtime as needed for sleep. 09/28/22  Yes Burns, Bobette Mo, MD  scopolamine (TRANSDERM-SCOP) 1 MG/3DAYS Place 1 patch (1.5 mg total) onto the skin every 3 (three) days. 01/18/22   Pincus Sanes, MD  gabapentin (NEURONTIN) 300 MG capsule Take 1 capsule (300 mg total) by mouth at bedtime. Patient not taking: Reported on 05/13/2015 06/05/12 08/05/20  Keitha Butte, NP    Family History Family History  Problem Relation Age of Onset   Hypertension Mother    Dementia Mother    Colon cancer Father     Social  History Social History   Tobacco Use   Smoking status: Never   Smokeless tobacco: Never  Vaping Use   Vaping Use: Never used  Substance Use Topics   Alcohol use: Yes    Comment: Occasionally   Drug use: No     Allergies   Other, Atorvastatin, Crestor [rosuvastatin], and Penicillins   Review of Systems Review of Systems  HENT:  Positive for congestion, sinus pressure and sinus pain.   Respiratory:  Positive for cough.      Physical Exam Triage Vital Signs ED Triage Vitals  Enc Vitals Group     BP 11/06/22 1332 (!) 149/79     Pulse Rate 11/06/22 1332 71     Resp 11/06/22 1332 16     Temp 11/06/22 1332 98.2 F (36.8 C)     Temp Source 11/06/22 1332 Oral     SpO2 11/06/22 1332 98 %     Weight --      Height --      Head Circumference --      Peak Flow --      Pain Score 11/06/22 1333 0     Pain Loc --      Pain Edu? --      Excl. in GC? --    No data found.  Updated Vital Signs BP (!) 149/79 (BP Location: Right Arm)   Pulse 71   Temp 98.2 F (36.8 C) (Oral)   Resp 16   SpO2 98%   Visual Acuity Right Eye Distance:   Left Eye Distance:   Bilateral Distance:    Right Eye Near:   Left Eye Near:    Bilateral Near:     Physical Exam Vitals and nursing note reviewed.  Constitutional:      General: She is not in acute distress.    Appearance: She is well-developed. She is not ill-appearing.  HENT:     Head: Normocephalic and atraumatic.     Right Ear: Tympanic membrane and ear canal normal.     Left Ear: Tympanic membrane and ear canal normal.     Nose: Congestion present.     Right Turbinates: Swollen.     Left Turbinates: Swollen.     Right Sinus: Maxillary sinus tenderness present.     Left Sinus: Maxillary sinus tenderness present.     Mouth/Throat:     Mouth: Mucous membranes are moist.     Pharynx: Oropharynx is clear. Uvula midline. No oropharyngeal exudate or posterior oropharyngeal erythema.     Tonsils: No tonsillar exudate or tonsillar  abscesses.  Eyes:     Conjunctiva/sclera: Conjunctivae normal.     Pupils: Pupils are equal, round, and reactive to light.  Cardiovascular:     Rate and Rhythm: Normal rate and regular  rhythm.     Heart sounds: Normal heart sounds.  Pulmonary:     Effort: Pulmonary effort is normal.     Breath sounds: Normal breath sounds.  Musculoskeletal:     Cervical back: Normal range of motion and neck supple.  Lymphadenopathy:     Cervical: No cervical adenopathy.  Skin:    General: Skin is warm and dry.  Neurological:     General: No focal deficit present.     Mental Status: She is alert and oriented to person, place, and time.  Psychiatric:        Mood and Affect: Mood normal.        Behavior: Behavior normal.      UC Treatments / Results  Labs (all labs ordered are listed, but only abnormal results are displayed) Labs Reviewed  SARS CORONAVIRUS 2 (TAT 6-24 HRS)   Comprehensive metabolic panel Order: [id number] Status: Final result     Visible to patient: Yes (seen)     Next appt: 11/23/2022 at 11:00 AM in Radiology (GI-315 CT 1)     Dx: Chest pain, unspecified type; Abnorma...   1 Result Note     1 Patient Communication          Component Ref Range & Units 5 mo ago 6 mo ago 8 mo ago 7 yr ago 9 yr ago 11 yr ago 12 yr ago  Sodium 135 - 145 mEq/L 140 138 R  141 R 142 R 136 138  Potassium 3.5 - 5.1 mEq/L 4.0 4.0 R  3.4 Low  R 4.0 4.4 R 4.1 R  Chloride 96 - 112 mEq/L 104 102 R  106 R 108 R 103 103  CO2 19 - 32 mEq/L 30 27 R  25 R 26 R 27 27  Glucose, Bld 70 - 99 mg/dL 87 409 High  CM  91 R 92 R 89 91  BUN 6 - 23 mg/dL 11 11 R  5 Low  R 81.1 R 12 12  Creatinine, Ser 0.40 - 1.20 mg/dL 9.14 7.82 R  9.56 R 0.9 R 0.82 R 0.80  Total Bilirubin 0.2 - 1.2 mg/dL 0.7  0.6  2.13 R 0.6 R 0.7 R  Alkaline Phosphatase 39 - 117 U/L 74  70  90 R 96 78  AST 0 - 37 U/L 19  18  14  R 15 20  ALT 0 - 35 U/L 24  21  18  R 21 22  Total Protein 6.0 - 8.3 g/dL 7.3  7.1  7.3 R 7.2 7.3   Albumin 3.5 - 5.2 g/dL 4.3  4.2  3.8 R 4.4 4.1  GFR >60.00 mL/min 64.86        Comment: Calculated using the CKD-EPI Creatinine Equation (2021)  Calcium 8.4 - 10.5 mg/dL 9.3 9.5 R  8.6 Low  R 9.0 R 9.4 9.0  Resulting Agency [facility]              EKG   Radiology No results found.  Procedures Procedures (including critical care time)  Medications Ordered in UC Medications - No data to display  Initial Impression / Assessment and Plan / UC Course  I have reviewed the triage vital signs and the nursing notes.  Pertinent labs & imaging results that were available during my care of the patient were reviewed by me and considered in my medical decision making (see chart for details).  Reviewed exam and symptoms with patient.  No red flags.  COVID PCR and will contact if positive.  Discussed with patient she is outside the window for antivirals and a positive test does not change her treatment.  Zithromax as prescribed.  Promethazine DM as needed for cough.  Side effect profile reviewed.  Flonase daily nasal rinses as tolerated.  PCP follow-up if symptoms do not improve.  ER precautions reviewed and patient verbalized understanding. Final Clinical Impressions(s) / UC Diagnoses   Final diagnoses:  Exposure to COVID-19 virus  Acute maxillary sinusitis, recurrence not specified  Acute bronchitis, unspecified organism     Discharge Instructions      Start Zithromax as prescribed.  You may take Promethazine DM as needed for cough.  Please of this medication will make you drowsy.  Do not drink alcohol or drive while you are on this medication.  Flonase daily and do nasal rinses as tolerated.  You may continue over-the-counter decongestants if needed.  Please follow-up with your PCP if your symptoms do not improve.  Please go to the emergency room for any worsening symptoms.  I hope you feel better  soon!   ED Prescriptions     Medication Sig Dispense Auth. Provider   azithromycin (ZITHROMAX) 250 MG tablet Take 1 tablet (250 mg total) by mouth daily. Take first 2 tablets together, then 1 every day until finished. 6 tablet Radford Pax, NP   promethazine-dextromethorphan (PROMETHAZINE-DM) 6.25-15 MG/5ML syrup Take 2.5 mLs by mouth 4 (four) times daily as needed for cough. 118 mL Radford Pax, NP   fluticasone (FLONASE) 50 MCG/ACT nasal spray Place 1 spray into both nostrils daily. 15.8 mL Radford Pax, NP      PDMP not reviewed this encounter.   Radford Pax, NP 11/06/22 1355    Radford Pax, NP 11/06/22 1356

## 2022-11-06 NOTE — ED Triage Notes (Signed)
Sore throat, congestion, cough, sinus pain that started 6 days ago. Pt recently got back from a cruise and 2 people she was with are positive for Covid. Taking cough syrup, and OTC 12 hr decongestant.

## 2022-11-07 LAB — SARS CORONAVIRUS 2 (TAT 6-24 HRS): SARS Coronavirus 2: POSITIVE — AB

## 2022-11-16 DIAGNOSIS — M79671 Pain in right foot: Secondary | ICD-10-CM | POA: Diagnosis not present

## 2022-11-22 DIAGNOSIS — Z85828 Personal history of other malignant neoplasm of skin: Secondary | ICD-10-CM | POA: Diagnosis not present

## 2022-11-22 DIAGNOSIS — Z85820 Personal history of malignant melanoma of skin: Secondary | ICD-10-CM | POA: Diagnosis not present

## 2022-11-22 DIAGNOSIS — D225 Melanocytic nevi of trunk: Secondary | ICD-10-CM | POA: Diagnosis not present

## 2022-11-22 DIAGNOSIS — D2261 Melanocytic nevi of right upper limb, including shoulder: Secondary | ICD-10-CM | POA: Diagnosis not present

## 2022-11-23 ENCOUNTER — Ambulatory Visit
Admission: RE | Admit: 2022-11-23 | Discharge: 2022-11-23 | Disposition: A | Discharge status: Home / Self Care | Payer: Medicare Other | Source: Ambulatory Visit | Attending: Internal Medicine | Admitting: Internal Medicine

## 2022-11-23 DIAGNOSIS — R911 Solitary pulmonary nodule: Secondary | ICD-10-CM

## 2022-11-23 DIAGNOSIS — R918 Other nonspecific abnormal finding of lung field: Secondary | ICD-10-CM | POA: Diagnosis not present

## 2022-11-29 ENCOUNTER — Encounter: Payer: Self-pay | Admitting: Internal Medicine

## 2022-12-06 DIAGNOSIS — M79642 Pain in left hand: Secondary | ICD-10-CM | POA: Diagnosis not present

## 2022-12-06 DIAGNOSIS — L608 Other nail disorders: Secondary | ICD-10-CM | POA: Diagnosis not present

## 2022-12-06 DIAGNOSIS — Z4789 Encounter for other orthopedic aftercare: Secondary | ICD-10-CM | POA: Diagnosis not present

## 2022-12-19 ENCOUNTER — Other Ambulatory Visit: Payer: Self-pay | Admitting: Internal Medicine

## 2022-12-23 ENCOUNTER — Encounter (INDEPENDENT_AMBULATORY_CARE_PROVIDER_SITE_OTHER): Payer: Self-pay

## 2023-01-06 ENCOUNTER — Other Ambulatory Visit (INDEPENDENT_AMBULATORY_CARE_PROVIDER_SITE_OTHER): Payer: Medicare Other

## 2023-01-06 DIAGNOSIS — E7800 Pure hypercholesterolemia, unspecified: Secondary | ICD-10-CM

## 2023-01-06 LAB — LIPID PANEL
Cholesterol: 209 mg/dL — ABNORMAL HIGH (ref 0–200)
HDL: 47.5 mg/dL (ref >39.00)
LDL Cholesterol: 134 mg/dL — ABNORMAL HIGH (ref 0–99)
NonHDL: 161.81
Total CHOL/HDL Ratio: 4
Triglycerides: 140 mg/dL (ref 0.0–149.0)
VLDL: 28 mg/dL (ref 0.0–40.0)

## 2023-01-06 LAB — HEPATIC FUNCTION PANEL
ALT: 23 U/L (ref 0–35)
AST: 18 U/L (ref 0–37)
Albumin: 3.9 g/dL (ref 3.5–5.2)
Alkaline Phosphatase: 75 U/L (ref 39–117)
Bilirubin, Direct: 0.1 mg/dL (ref 0.0–0.3)
Total Bilirubin: 0.6 mg/dL (ref 0.2–1.2)
Total Protein: 6.8 g/dL (ref 6.0–8.3)

## 2023-01-07 ENCOUNTER — Encounter: Payer: Self-pay | Admitting: Internal Medicine

## 2023-01-15 ENCOUNTER — Emergency Department (HOSPITAL_BASED_OUTPATIENT_CLINIC_OR_DEPARTMENT_OTHER): Payer: Medicare Other

## 2023-01-15 ENCOUNTER — Encounter (HOSPITAL_BASED_OUTPATIENT_CLINIC_OR_DEPARTMENT_OTHER): Payer: Self-pay | Admitting: Emergency Medicine

## 2023-01-15 ENCOUNTER — Emergency Department (HOSPITAL_BASED_OUTPATIENT_CLINIC_OR_DEPARTMENT_OTHER)
Admission: EM | Admit: 2023-01-15 | Discharge: 2023-01-15 | Disposition: A | Discharge status: Home / Self Care | Payer: Medicare Other | Attending: Emergency Medicine | Admitting: Emergency Medicine

## 2023-01-15 DIAGNOSIS — S39012A Strain of muscle, fascia and tendon of lower back, initial encounter: Secondary | ICD-10-CM | POA: Diagnosis not present

## 2023-01-15 DIAGNOSIS — S335XXA Sprain of ligaments of lumbar spine, initial encounter: Secondary | ICD-10-CM | POA: Diagnosis not present

## 2023-01-15 DIAGNOSIS — Z8616 Personal history of COVID-19: Secondary | ICD-10-CM | POA: Insufficient documentation

## 2023-01-15 DIAGNOSIS — N200 Calculus of kidney: Secondary | ICD-10-CM | POA: Diagnosis not present

## 2023-01-15 DIAGNOSIS — R110 Nausea: Secondary | ICD-10-CM | POA: Insufficient documentation

## 2023-01-15 DIAGNOSIS — S3992XA Unspecified injury of lower back, initial encounter: Secondary | ICD-10-CM | POA: Diagnosis not present

## 2023-01-15 DIAGNOSIS — Z853 Personal history of malignant neoplasm of breast: Secondary | ICD-10-CM | POA: Insufficient documentation

## 2023-01-15 DIAGNOSIS — X500XXA Overexertion from strenuous movement or load, initial encounter: Secondary | ICD-10-CM | POA: Diagnosis not present

## 2023-01-15 DIAGNOSIS — R109 Unspecified abdominal pain: Secondary | ICD-10-CM | POA: Diagnosis not present

## 2023-01-15 LAB — URINALYSIS, ROUTINE W REFLEX MICROSCOPIC
Bilirubin Urine: NEGATIVE
Glucose, UA: NEGATIVE mg/dL
Hgb urine dipstick: NEGATIVE
Ketones, ur: NEGATIVE mg/dL
Leukocytes,Ua: NEGATIVE
Nitrite: NEGATIVE
Protein, ur: NEGATIVE mg/dL
Specific Gravity, Urine: <1.005 — ABNORMAL LOW (ref 1.005–1.030)
pH: 6.5 (ref 5.0–8.0)

## 2023-01-15 MED ORDER — METHOCARBAMOL 500 MG PO TABS: 500.0000 mg | ORAL_TABLET | Freq: Two times a day (BID) | ORAL | 0 refills | Status: AC | PRN

## 2023-01-15 MED ORDER — ONDANSETRON HCL 4 MG PO TABS: 4.0000 mg | ORAL_TABLET | Freq: Four times a day (QID) | ORAL | 0 refills | Status: DC | PRN

## 2023-01-15 NOTE — Discharge Instructions (Signed)

## 2023-01-15 NOTE — ED Triage Notes (Signed)
Lower back pain and some right sided pain x 3 days "Feels like when I had kidney stone but not as bad yet" Home uti test kit "trace"

## 2023-01-15 NOTE — ED Provider Notes (Signed)
[facility] EMERGENCY DEPARTMENT AT [facility] Provider Note   CSN: [id number] Arrival date & time: 01/15/23  1537     History  Chief Complaint  Patient presents with   Back Pain    Ashley Norton is a 67 y.o. female with past medical history significant for breast cancer, previous COVID, previous nephrolithiasis who presents concern for some right sided flank pain for 3 days.  Patient does report that she was setting out some decorations and doing some more lifting than she normally would, she has not tried anything for the pain.  She took a UTI test from the store and reports that it read "trace".  She cannot tell me whether this meant trace evidence of infection or blood.  She denies any hematuria, she denies any dysuria.  She reports that it feels mildly similar to previous kidney stone but not quite so severe.  She reports that she is going out of town soon and wants to figure out what is going on to make sure that she is safe to go out of town.  She does endorse some mild nausea.   Back Pain      Home Medications Prior to Admission medications   Medication Sig Start Date End Date Taking? Authorizing Provider  methocarbamol (ROBAXIN) 500 MG tablet Take 1 tablet (500 mg total) by mouth 2 (two) times daily as needed for muscle spasms. 01/15/23  Yes Urijah Raynor H, PA-C  ondansetron (ZOFRAN) 4 MG tablet Take 1 tablet (4 mg total) by mouth every 6 (six) hours as needed for nausea or vomiting. 01/15/23  Yes Glorianne Proctor H, PA-C  ALPRAZolam (XANAX) 0.25 MG tablet TAKE 1 TABLET BY MOUTH TWICE A DAY AS NEEDED 12/20/22   Burns, Bobette Mo, MD  ascorbic acid (VITAMIN C) 1000 MG tablet Vitamin C 1,000 mg tablet    [provider]  azithromycin (ZITHROMAX) 250 MG tablet Take 1 tablet (250 mg total) by mouth daily. Take first 2 tablets together, then 1 every day until finished. 11/06/22   Radford Pax, NP  buPROPion (WELLBUTRIN XL) 150 MG 24 hr tablet Take 1 tablet  (150 mg total) by mouth at bedtime. 03/09/22   Pincus Sanes, MD  fluticasone (FLONASE) 50 MCG/ACT nasal spray Place 1 spray into both nostrils daily. 11/06/22   Radford Pax, NP  Loratadine (CLARITIN PO) Take by mouth.    [provider]  Multiple Vitamins-Minerals (MULTI FOR HER PO) Take by mouth.    [provider]  ondansetron (ZOFRAN-ODT) 8 MG disintegrating tablet Take 1 tablet (8 mg total) by mouth every 8 (eight) hours as needed for nausea or vomiting. 05/13/15   Benjiman Core, MD  pantoprazole (PROTONIX) 40 MG tablet 1 tablet 07/24/20   [provider]  promethazine-dextromethorphan (PROMETHAZINE-DM) 6.25-15 MG/5ML syrup Take 2.5 mLs by mouth 4 (four) times daily as needed for cough. 11/06/22   Radford Pax, NP  RETIN-A 0.05 % cream Apply topically at bedtime. 04/19/22   [provider]  scopolamine (TRANSDERM-SCOP) 1 MG/3DAYS Place 1 patch (1.5 mg total) onto the skin every 3 (three) days. 01/18/22   Pincus Sanes, MD  Vitamin D, Ergocalciferol, (DRISDOL) 50000 units CAPS capsule Take 50,000 Units by mouth 2 (two) times a week.    [provider]  Zinc 30 MG TABS Take by mouth.    [provider]  zolpidem (AMBIEN) 10 MG tablet Take 0.5 tablets (5 mg total) by mouth at bedtime as needed  for sleep. 09/28/22   Pincus Sanes, MD  gabapentin (NEURONTIN) 300 MG capsule Take 1 capsule (300 mg total) by mouth at bedtime. Patient not taking: Reported on 05/13/2015 06/05/12 08/05/20  Keitha Butte, NP      Allergies    Other, Atorvastatin, Crestor [rosuvastatin], and Penicillins    Review of Systems   Review of Systems  Musculoskeletal:  Positive for back pain.  All other systems reviewed and are negative.   Physical Exam Updated Vital Signs BP 139/61 (BP Location: Right Arm)   Pulse 71   Temp 98.1 F (36.7 C)   Resp 17   SpO2 100%  Physical Exam Vitals and nursing note reviewed.  Constitutional:      General: She is not  in acute distress.    Appearance: Normal appearance.  HENT:     Head: Normocephalic and atraumatic.  Eyes:     General:        Right eye: No discharge.        Left eye: No discharge.  Cardiovascular:     Rate and Rhythm: Normal rate and regular rhythm.  Pulmonary:     Effort: Pulmonary effort is normal. No respiratory distress.  Abdominal:     Comments: No focal tenderness throughout the abdomen, no flank pain.  No rebound, rigidity, guarding.  Normal bowel sounds throughout.  Musculoskeletal:        General: No deformity.     Comments: Patient was some focal tenderness bilateral lumbar paraspinous region, no midline spinal tenderness.  Intact strength and normal ambulation of bilateral lower extremities.  Skin:    General: Skin is warm and dry.  Neurological:     Mental Status: She is alert and oriented to person, place, and time.  Psychiatric:        Mood and Affect: Mood normal.        Behavior: Behavior normal.     ED Results / Procedures / Treatments   Labs (all labs ordered are listed, but only abnormal results are displayed) Labs Reviewed  URINALYSIS, ROUTINE W REFLEX MICROSCOPIC - Abnormal; Notable for the following components:      Result Value   Color, Urine COLORLESS (*)    Specific Gravity, Urine <1.005 (*)    All other components within normal limits    EKG None  Radiology CT Renal Stone Study  Result Date: 01/15/2023 CLINICAL DATA:  Right-sided flank and back pain for 3 days. Nephrolithiasis. EXAM: CT ABDOMEN AND PELVIS WITHOUT CONTRAST TECHNIQUE: Multidetector CT imaging of the abdomen and pelvis was performed following the standard protocol without IV contrast. RADIATION DOSE REDUCTION: This exam was performed according to the departmental dose-optimization program which includes automated exposure control, adjustment of the mA and/or kV according to patient size and/or use of iterative reconstruction technique. COMPARISON:  04/27/2022 FINDINGS: Lower chest:  No acute findings. Hepatobiliary: No mass visualized on this unenhanced exam. Prior cholecystectomy. No evidence of biliary obstruction. Pancreas: No mass or inflammatory process visualized on this unenhanced exam. Spleen:  Within normal limits in size. Adrenals/Urinary tract: No evidence of urolithiasis or hydronephrosis. Unremarkable unopacified urinary bladder. Stomach/Bowel: No evidence of obstruction, inflammatory process, or abnormal fluid collections. Normal appendix visualized. Vascular/Lymphatic: No pathologically enlarged lymph nodes identified. No evidence of abdominal aortic aneurysm. Reproductive: Prior hysterectomy noted. Adnexal regions are unremarkable in appearance. Other:  None. Musculoskeletal:  No suspicious bone lesions identified. IMPRESSION: No evidence of urolithiasis, hydronephrosis, or other acute findings. Electronically Signed   By: Mayra Neer  Eppie Gibson M.D.   On: 01/15/2023 17:23    Procedures Procedures    Medications Ordered in ED Medications - No data to display  ED Course/ Medical Decision Making/ A&P                                 Medical Decision Making Amount and/or Complexity of Data Reviewed Labs: ordered. Radiology: ordered.   This patient is a 67 y.o. female  who presents to the ED for concern of flank pain, back pain.   Differential diagnoses prior to evaluation: The emergent differential diagnosis includes, but is not limited to,  AAA, renal artery/vein embolism/thrombosis, mesenteric ischemia, pyelonephritis, nephrolithiasis, cystitis, biliary colic, pancreatitis, perforated peptic ulcer, appendicitis, diverticulitis, bowel obstruction. Additionally considered MSK origin such as lumbar strain, sprain, radiculopathy. This is not an exhaustive differential.   Past Medical History / Co-morbidities / Social History: breast cancer, previous COVID, previous nephrolithiasis  Physical Exam: Physical exam performed. The pertinent findings include: No focal  tenderness throughout the abdomen, no flank pain.  No rebound, rigidity, guarding.  Normal bowel sounds throughout.   Patient was some focal tenderness bilateral lumbar paraspinous region, no midline spinal tenderness.  Intact strength and normal ambulation of bilateral lower extremities.   Lab Tests/Imaging studies: I personally interpreted labs/imaging and the pertinent results include:  UA unremarkable. CT stone study with no evidence of acute intrathoracic findings. I agree with the radiologist interpretation.   Medications: I have reviewed the patients home medicines and have made adjustments as needed. Encouraged ibuprofen, tylenol, robaxin, lumbar strain rehab exercises.   Disposition: After consideration of the diagnostic results and the patients response to treatment, I feel that patient is stable for discharge at this time, return precautions given .   emergency department workup does not suggest an emergent condition requiring admission or immediate intervention beyond what has been performed at this time. The plan is: as above. The patient is safe for discharge and has been instructed to return immediately for worsening symptoms, change in symptoms or any other concerns.  Final Clinical Impression(s) / ED Diagnoses Final diagnoses:  Strain of lumbar region, initial encounter    Rx / DC Orders ED Discharge Orders          Ordered    methocarbamol (ROBAXIN) 500 MG tablet  2 times daily PRN        01/15/23 1753    ondansetron (ZOFRAN) 4 MG tablet  Every 6 hours PRN        01/15/23 1753              Ethridge Sollenberger, Harrel Carina, PA-C 01/15/23 1824    Rexford Maus, DO 01/15/23 1920

## 2023-01-23 ENCOUNTER — Encounter: Payer: Self-pay | Admitting: Internal Medicine

## 2023-01-23 DIAGNOSIS — F4323 Adjustment disorder with mixed anxiety and depressed mood: Secondary | ICD-10-CM | POA: Insufficient documentation

## 2023-01-23 NOTE — Progress Notes (Unsigned)
Subjective:    Patient ID: Ashley Norton, female    DOB: [birth year], 67 y.o.   MRN: [id number]      HPI Ashley Norton is here for a Physical exam and her chronic medical problems.    ED 9/7 - went for back pain.  Dx lower back strain - rx'd methocarbamol, zofran.  Back is better.  Due for colonoscopy this year-she plans on getting that scheduled.     Medications and allergies reviewed with patient and updated if appropriate.  Current Outpatient Medications on File Prior to Visit  Medication Sig Dispense Refill   ALPRAZolam (XANAX) 0.25 MG tablet TAKE 1 TABLET BY MOUTH TWICE A DAY AS NEEDED 30 tablet 0   ascorbic acid (VITAMIN C) 1000 MG tablet Vitamin C 1,000 mg tablet     buPROPion (WELLBUTRIN XL) 150 MG 24 hr tablet Take 1 tablet (150 mg total) by mouth at bedtime. 90 tablet 3   fluticasone (FLONASE) 50 MCG/ACT nasal spray Place 1 spray into both nostrils daily. 15.8 mL 0   Loratadine (CLARITIN PO) Take by mouth.     methocarbamol (ROBAXIN) 500 MG tablet Take 1 tablet (500 mg total) by mouth 2 (two) times daily as needed for muscle spasms. 20 tablet 0   Multiple Vitamins-Minerals (MULTI FOR HER PO) Take by mouth.     ondansetron (ZOFRAN) 4 MG tablet Take 1 tablet (4 mg total) by mouth every 6 (six) hours as needed for nausea or vomiting. 18 tablet 0   ondansetron (ZOFRAN-ODT) 8 MG disintegrating tablet Take 1 tablet (8 mg total) by mouth every 8 (eight) hours as needed for nausea or vomiting. 10 tablet 0   pantoprazole (PROTONIX) 40 MG tablet 1 tablet     RETIN-A 0.05 % cream Apply topically at bedtime.     traMADol (ULTRAM) 50 MG tablet Take 50 mg by mouth every 4 (four) hours as needed.     Vitamin D, Ergocalciferol, (DRISDOL) 50000 units CAPS capsule Take 50,000 Units by mouth 2 (two) times a week.     Zinc 30 MG TABS Take by mouth.     zolpidem (AMBIEN) 10 MG tablet Take 0.5 tablets (5 mg total) by mouth at bedtime as needed for sleep. 30 tablet 0   [DISCONTINUED]  gabapentin (NEURONTIN) 300 MG capsule Take 1 capsule (300 mg total) by mouth at bedtime. (Patient not taking: Reported on 05/13/2015) 90 capsule 4   No current facility-administered medications on file prior to visit.    Review of Systems  Constitutional:  Negative for fever.  Eyes:  Negative for visual disturbance.  Respiratory:  Negative for cough, shortness of breath and wheezing.   Cardiovascular:  Negative for chest pain and palpitations. Leg swelling: sometimes. Gastrointestinal:  Negative for abdominal pain, blood in stool, constipation and diarrhea.       No gerd  Genitourinary:  Negative for dysuria.  Musculoskeletal:  Negative for arthralgias and back pain.  Skin:  Negative for rash.  Neurological:  Negative for light-headedness and headaches.  Psychiatric/Behavioral:  Positive for sleep disturbance. Negative for dysphoric mood. The patient is nervous/anxious.        Objective:   Vitals:   01/24/23 0822  BP: 120/80  Pulse: 77  Temp: 98 F (36.7 C)  SpO2: 97%   Filed Weights   01/24/23 0822  Weight: 159 lb (72.1 kg)   Body mass index is 26.46 kg/m.  BP Readings from Last 3 Encounters:  01/24/23 120/80  01/15/23 139/61  11/06/22 Marland Kitchen)  149/79    Wt Readings from Last 3 Encounters:  01/24/23 159 lb (72.1 kg)  09/28/22 157 lb (71.2 kg)  06/10/22 156 lb 9.6 oz (71 kg)       Physical Exam Constitutional: She appears well-developed and well-nourished. No distress.  HENT:  Head: Normocephalic and atraumatic.  Right Ear: External ear normal. Normal ear canal and TM Left Ear: External ear normal.  Normal ear canal and TM Mouth/Throat: Oropharynx is clear and moist.  Eyes: Conjunctivae normal.  Neck: Neck supple. No tracheal deviation present. No thyromegaly present.  No carotid bruit  Cardiovascular: Normal rate, regular rhythm and normal heart sounds.   No murmur heard.  No edema. Pulmonary/Chest: Effort normal and breath sounds normal. No respiratory  distress. She has no wheezes. She has no rales.  Breast: deferred   Abdominal: Soft. She exhibits no distension. There is no tenderness.  Lymphadenopathy: She has no cervical adenopathy.  Skin: Skin is warm and dry. She is not diaphoretic.  Psychiatric: She has a normal mood and affect. Her behavior is normal.     Lab Results  Component Value Date   WBC 5.5 06/09/2022   HGB 13.0 06/09/2022   HCT 37.6 06/09/2022   PLT 268.0 06/09/2022   GLUCOSE 87 06/09/2022   CHOL 209 (H) 01/06/2023   TRIG 140.0 01/06/2023   HDL 47.50 01/06/2023   LDLCALC 134 (H) 01/06/2023   ALT 23 01/06/2023   AST 18 01/06/2023   NA 140 06/09/2022   K 4.0 06/09/2022   CL 104 06/09/2022   CREATININE 0.92 06/09/2022   BUN 11 06/09/2022   CO2 30 06/09/2022   HGBA1C 5.4 06/09/2022         Assessment & Plan:   Physical exam: Screening blood work  ordered Exercise  not regular since broke her foot - will get back in to it Weight good Substance abuse  none   Reviewed recommended immunizations.  Flu vaccine given today.  She will get pneumonia vaccine at the pharmacy.   Health Maintenance  Topic Date Due   Hepatitis C Screening  Never done   DTaP/Tdap/Td (1 - Tdap) Never done   Colonoscopy  Never done   Pneumonia Vaccine 34+ Years old (1 of 1 - PCV) Never done   INFLUENZA VACCINE  12/09/2022   COVID-19 Vaccine (2 - 2023-24 season) 02/09/2023 (Originally 01/09/2023)   Zoster Vaccines- Shingrix (1 of 2) 04/25/2023 (Originally 11/13/2005)   Medicare Annual Wellness (AWV)  04/23/2023   MAMMOGRAM  06/07/2024   DEXA SCAN  Completed   HPV VACCINES  Aged Out          See Problem List for Assessment and Plan of chronic medical problems.   Follow-up in 1 year

## 2023-01-23 NOTE — Patient Instructions (Addendum)
Flu immunization administered today.     Blood work was ordered.   Have this done in 6 weeks.     Medications changes include :   pravastatin 10 mg daily     Return in about 1 year (around 01/24/2024) for Physical Exam.    Health Maintenance, Female Adopting a healthy lifestyle and getting preventive care are important in promoting health and wellness. Ask your health care provider about: The right schedule for you to have regular tests and exams. Things you can do on your own to prevent diseases and keep yourself healthy. What should I know about diet, weight, and exercise? Eat a healthy diet  Eat a diet that includes plenty of vegetables, fruits, low-fat dairy products, and lean protein. Do not eat a lot of foods that are high in solid fats, added sugars, or sodium. Maintain a healthy weight Body mass index (BMI) is used to identify weight problems. It estimates body fat based on height and weight. Your health care provider can help determine your BMI and help you achieve or maintain a healthy weight. Get regular exercise Get regular exercise. This is one of the most important things you can do for your health. Most adults should: Exercise for at least 150 minutes each week. The exercise should increase your heart rate and make you sweat (moderate-intensity exercise). Do strengthening exercises at least twice a week. This is in addition to the moderate-intensity exercise. Spend less time sitting. Even light physical activity can be beneficial. Watch cholesterol and blood lipids Have your blood tested for lipids and cholesterol at 67 years of age, then have this test every 5 years. Have your cholesterol levels checked more often if: Your lipid or cholesterol levels are high. You are older than 67 years of age. You are at high risk for heart disease. What should I know about cancer screening? Depending on your health history and family history, you may need to have  cancer screening at various ages. This may include screening for: Breast cancer. Cervical cancer. Colorectal cancer. Skin cancer. Lung cancer. What should I know about heart disease, diabetes, and high blood pressure? Blood pressure and heart disease High blood pressure causes heart disease and increases the risk of stroke. This is more likely to develop in people who have high blood pressure readings or are overweight. Have your blood pressure checked: Every 3-5 years if you are 67-73 years of age. Every year if you are 35 years old or older. Diabetes Have regular diabetes screenings. This checks your fasting blood sugar level. Have the screening done: Once every three years after age 70 if you are at a normal weight and have a low risk for diabetes. More often and at a younger age if you are overweight or have a high risk for diabetes. What should I know about preventing infection? Hepatitis B If you have a higher risk for hepatitis B, you should be screened for this virus. Talk with your health care provider to find out if you are at risk for hepatitis B infection. Hepatitis C Testing is recommended for: Everyone born from [birth year] through 1965. Anyone with known risk factors for hepatitis C. Sexually transmitted infections (STIs) Get screened for STIs, including gonorrhea and chlamydia, if: You are sexually active and are younger than 67 years of age. You are older than 67 years of age and your health care provider tells you that you are at risk for this type of infection. Your sexual activity  has changed since you were last screened, and you are at increased risk for chlamydia or gonorrhea. Ask your health care provider if you are at risk. Ask your health care provider about whether you are at high risk for HIV. Your health care provider may recommend a prescription medicine to help prevent HIV infection. If you choose to take medicine to prevent HIV, you should first get tested for HIV.  You should then be tested every 3 months for as long as you are taking the medicine. Pregnancy If you are about to stop having your period (premenopausal) and you may become pregnant, seek counseling before you get pregnant. Take 400 to 800 micrograms (mcg) of folic acid every day if you become pregnant. Ask for birth control (contraception) if you want to prevent pregnancy. Osteoporosis and menopause Osteoporosis is a disease in which the bones lose minerals and strength with aging. This can result in bone fractures. If you are 107 years old or older, or if you are at risk for osteoporosis and fractures, ask your health care provider if you should: Be screened for bone loss. Take a calcium or vitamin D supplement to lower your risk of fractures. Be given hormone replacement therapy (HRT) to treat symptoms of menopause. Follow these instructions at home: Alcohol use Do not drink alcohol if: Your health care provider tells you not to drink. You are pregnant, may be pregnant, or are planning to become pregnant. If you drink alcohol: Limit how much you have to: 0-1 drink a day. Know how much alcohol is in your drink. In the [place] one drink equals one 12 oz bottle of beer (355 mL), one 5 oz glass of wine (148 mL), or one 1 oz glass of hard liquor (44 mL). Lifestyle Do not use any products that contain nicotine or tobacco. These products include cigarettes, chewing tobacco, and vaping devices, such as e-cigarettes. If you need help quitting, ask your health care provider. Do not use street drugs. Do not share needles. Ask your health care provider for help if you need support or information about quitting drugs. General instructions Schedule regular health, dental, and eye exams. Stay current with your vaccines. Tell your health care provider if: You often feel depressed. You have ever been abused or do not feel safe at home. Summary Adopting a healthy lifestyle and getting preventive care  are important in promoting health and wellness. Follow your health care provider's instructions about healthy diet, exercising, and getting tested or screened for diseases. Follow your health care provider's instructions on monitoring your cholesterol and blood pressure. This information is not intended to replace advice given to you by your health care provider. Make sure you discuss any questions you have with your health care provider. Document Revised: 09/15/2020 Document Reviewed: 09/15/2020 Elsevier Patient Education  2024 [employer].

## 2023-01-24 ENCOUNTER — Encounter: Payer: Self-pay | Admitting: Internal Medicine

## 2023-01-24 ENCOUNTER — Ambulatory Visit (INDEPENDENT_AMBULATORY_CARE_PROVIDER_SITE_OTHER): Payer: Medicare Other | Admitting: Internal Medicine

## 2023-01-24 VITALS — BP 120/80 | HR 77 | Temp 98.0°F | Ht 65.0 in | Wt 159.0 lb

## 2023-01-24 DIAGNOSIS — F4323 Adjustment disorder with mixed anxiety and depressed mood: Secondary | ICD-10-CM | POA: Diagnosis not present

## 2023-01-24 DIAGNOSIS — E7800 Pure hypercholesterolemia, unspecified: Secondary | ICD-10-CM

## 2023-01-24 DIAGNOSIS — Z0000 Encounter for general adult medical examination without abnormal findings: Secondary | ICD-10-CM

## 2023-01-24 DIAGNOSIS — Z23 Encounter for immunization: Secondary | ICD-10-CM | POA: Diagnosis not present

## 2023-01-24 DIAGNOSIS — Z85820 Personal history of malignant melanoma of skin: Secondary | ICD-10-CM

## 2023-01-24 DIAGNOSIS — Z1159 Encounter for screening for other viral diseases: Secondary | ICD-10-CM

## 2023-01-24 MED ORDER — PRAVASTATIN SODIUM 10 MG PO TABS: 10.0000 mg | ORAL_TABLET | Freq: Every day | ORAL | 5 refills | Status: DC

## 2023-01-24 NOTE — Assessment & Plan Note (Addendum)
Chronic Intolerant to statins-Crestor, atorvastatin-will get her myalgias Coronary calcium score is 0 so she is low risk Continue regular exercise, healthy diet Start pravastatin 10 mg daily-hopefully she will tolerate this low-dose and discussed we may need to increase it depending on response Check lipids, CMP, TSH, CBC-to be done in 6 weeks

## 2023-01-24 NOTE — Assessment & Plan Note (Signed)
History of melanoma Sees dermatology twice yearly

## 2023-01-24 NOTE — Assessment & Plan Note (Addendum)
Chronic Well-controlled Continue bupropion XL 150 mg daily, alprazolam 0.25 mg twice daily as needed, Ambien 5 mg at bedtime as needed

## 2023-01-27 DIAGNOSIS — K080 Exfoliation of teeth due to systemic causes: Secondary | ICD-10-CM | POA: Diagnosis not present

## 2023-02-10 ENCOUNTER — Telehealth: Payer: Self-pay

## 2023-02-10 NOTE — Telephone Encounter (Signed)
Transition Care Management Follow-up Telephone Call Date of discharge and from where: 01/15/2023 [facility] How have you been since you were released from the hospital? Patient stated she is feeling much better. Any questions or concerns? No  Items Reviewed: Did the pt receive and understand the discharge instructions provided? Yes  Medications obtained and verified? Yes  Other? No  Any new allergies since your discharge? No  Dietary orders reviewed? Yes Do you have support at home? Yes   Follow up appointments reviewed:  PCP Hospital f/u appt confirmed? Yes  Scheduled to see Pincus Sanes, MD on 01/24/2023 @ [facility] [place]. Specialist Hospital f/u appt confirmed? No  Scheduled to see  on  @ . Are transportation arrangements needed? No  If their condition worsens, is the pt aware to call PCP or go to the Emergency Dept.? Yes Was the patient provided with contact information for the PCP's office or ED? Yes Was to pt encouraged to call back with questions or concerns? Yes  Ajit Errico [facility]  [facility], [facility] Guide Direct Dial: [phone]  Website: [email or web address]

## 2023-02-11 ENCOUNTER — Encounter: Payer: Self-pay | Admitting: Internal Medicine

## 2023-02-21 ENCOUNTER — Ambulatory Visit (INDEPENDENT_AMBULATORY_CARE_PROVIDER_SITE_OTHER): Payer: Medicare Other | Admitting: Internal Medicine

## 2023-02-21 ENCOUNTER — Encounter: Payer: Self-pay | Admitting: Internal Medicine

## 2023-02-21 VITALS — BP 118/60 | HR 68 | Temp 98.2°F | Ht 65.0 in | Wt 159.0 lb

## 2023-02-21 DIAGNOSIS — F4323 Adjustment disorder with mixed anxiety and depressed mood: Secondary | ICD-10-CM

## 2023-02-21 DIAGNOSIS — B309 Viral conjunctivitis, unspecified: Secondary | ICD-10-CM | POA: Insufficient documentation

## 2023-02-21 DIAGNOSIS — J029 Acute pharyngitis, unspecified: Secondary | ICD-10-CM | POA: Diagnosis not present

## 2023-02-21 MED ORDER — ALPRAZOLAM 0.25 MG PO TABS: 0.2500 mg | ORAL_TABLET | Freq: Two times a day (BID) | ORAL | 0 refills | Status: DC | PRN

## 2023-02-21 MED ORDER — NYSTATIN 100000 UNIT/ML MT SUSP: 5.0000 mL | Freq: Four times a day (QID) | OROMUCOSAL | 0 refills | Status: DC | PRN

## 2023-02-21 NOTE — Assessment & Plan Note (Signed)
Acute Right conjunctivitis erythematous starting today Advised warm compresses Call if symptoms do not improve over the next couple of days

## 2023-02-21 NOTE — Patient Instructions (Addendum)
     You were tested for strep.      Medications changes include :   none      Return if symptoms worsen or fail to improve.

## 2023-02-21 NOTE — Assessment & Plan Note (Signed)
Chronic Well-controlled Continue bupropion XL 150 mg daily, alprazolam 0.25 mg twice daily as needed, Ambien 5 mg at bedtime as needed Xanax refilled today

## 2023-02-21 NOTE — Assessment & Plan Note (Signed)
Acute Rapid strep negative Likely viral in nature Magic mouthwash sent to pharmacy Advil alternating with Tylenol Rest, fluids Call if no improvement

## 2023-02-21 NOTE — Progress Notes (Signed)
Subjective:    Patient ID: Ashley Norton, female    DOB: [birth year], 67 y.o.   MRN: [id number]      HPI Ashley Norton is here for  Chief Complaint  Patient presents with   Sore Throat    Sore throat on the left side; thinks it might be a tonsil stone; nauseated     Sore throat started last Thursday - felt like something stuck in throat.  Something white stuck on left side of throat.  Thought it was a tonsil stone- it came off - never had one before.  Throat just felt like something was there.  Started gargling with water salt water.  Was able to see some clear bumps on left side.  Today felt like she had something in the throat.  No difficulty swallowing.  Feels a little more tired.  Some mild nasal congestion.  Has had some nausea and a little sinus pressure.  At times feels a little lightheaded.  Today she woke up and her right eye is slightly red  Taken tylenol, flonase     Medications and allergies reviewed with patient and updated if appropriate.  Current Outpatient Medications on File Prior to Visit  Medication Sig Dispense Refill   ALPRAZolam (XANAX) 0.25 MG tablet TAKE 1 TABLET BY MOUTH TWICE A DAY AS NEEDED 30 tablet 0   ascorbic acid (VITAMIN C) 1000 MG tablet Vitamin C 1,000 mg tablet     buPROPion (WELLBUTRIN XL) 150 MG 24 hr tablet Take 1 tablet (150 mg total) by mouth at bedtime. 90 tablet 3   fluticasone (FLONASE) 50 MCG/ACT nasal spray Place 1 spray into both nostrils daily. 15.8 mL 0   Loratadine (CLARITIN PO) Take by mouth.     methocarbamol (ROBAXIN) 500 MG tablet Take 1 tablet (500 mg total) by mouth 2 (two) times daily as needed for muscle spasms. 20 tablet 0   Multiple Vitamins-Minerals (MULTI FOR HER PO) Take by mouth.     ondansetron (ZOFRAN) 4 MG tablet Take 1 tablet (4 mg total) by mouth every 6 (six) hours as needed for nausea or vomiting. 18 tablet 0   ondansetron (ZOFRAN-ODT) 8 MG disintegrating tablet Take 1 tablet (8 mg total) by mouth every 8  (eight) hours as needed for nausea or vomiting. 10 tablet 0   pantoprazole (PROTONIX) 40 MG tablet 1 tablet     pravastatin (PRAVACHOL) 10 MG tablet Take 1 tablet (10 mg total) by mouth daily. 30 tablet 5   RETIN-A 0.05 % cream Apply topically at bedtime.     traMADol (ULTRAM) 50 MG tablet Take 50 mg by mouth every 4 (four) hours as needed.     Vitamin D, Ergocalciferol, (DRISDOL) 50000 units CAPS capsule Take 50,000 Units by mouth 2 (two) times a week.     Zinc 30 MG TABS Take by mouth.     zolpidem (AMBIEN) 10 MG tablet Take 0.5 tablets (5 mg total) by mouth at bedtime as needed for sleep. 30 tablet 0   [DISCONTINUED] gabapentin (NEURONTIN) 300 MG capsule Take 1 capsule (300 mg total) by mouth at bedtime. (Patient not taking: Reported on 05/13/2015) 90 capsule 4   No current facility-administered medications on file prior to visit.    Review of Systems  Constitutional:  Positive for fatigue. Negative for fever.  HENT:  Positive for congestion, sinus pressure (mild) and sore throat (left side). Negative for ear pain, sinus pain and trouble swallowing.   Eyes:  Positive for redness.  Respiratory:  Negative for cough, shortness of breath and wheezing.   Gastrointestinal:  Positive for nausea.  Neurological:  Positive for light-headedness (little). Negative for headaches.       Objective:   Vitals:   02/21/23 1521  BP: 118/60  Pulse: 68  Temp: 98.2 F (36.8 C)  SpO2: 97%   BP Readings from Last 3 Encounters:  02/21/23 118/60  01/24/23 120/80  01/15/23 139/61   Wt Readings from Last 3 Encounters:  02/21/23 159 lb (72.1 kg)  01/24/23 159 lb (72.1 kg)  09/28/22 157 lb (71.2 kg)   Body mass index is 26.46 kg/m.    Physical Exam Constitutional:      General: She is not in acute distress.    Appearance: Normal appearance. She is not ill-appearing.  HENT:     Head: Normocephalic and atraumatic.     Right Ear: Tympanic membrane, ear canal and external ear normal.     Left  Ear: Tympanic membrane, ear canal and external ear normal.     Mouth/Throat:     Mouth: Mucous membranes are moist. No oral lesions.     Pharynx: Posterior oropharyngeal erythema (left tonsil) present. No oropharyngeal exudate.  Eyes:     Comments: Right eye conjunctivitis erythematous.  Left eye normal  Cardiovascular:     Rate and Rhythm: Normal rate and regular rhythm.  Pulmonary:     Effort: Pulmonary effort is normal. No respiratory distress.     Breath sounds: Normal breath sounds. No wheezing or rales.  Musculoskeletal:     Cervical back: Neck supple. No tenderness.  Lymphadenopathy:     Cervical: No cervical adenopathy.  Skin:    General: Skin is warm and dry.  Neurological:     Mental Status: She is alert.        Rapid strep negative    Assessment & Plan:    See Problem List for Assessment and Plan of chronic medical problems.

## 2023-02-22 LAB — POCT RAPID STREP A (OFFICE): Rapid Strep A Screen: NEGATIVE

## 2023-02-22 NOTE — Addendum Note (Signed)
Addended by: Karma Ganja on: 02/22/2023 08:09 AM   Modules accepted: Orders

## 2023-02-23 DIAGNOSIS — Z4789 Encounter for other orthopedic aftercare: Secondary | ICD-10-CM | POA: Diagnosis not present

## 2023-02-25 DIAGNOSIS — N951 Menopausal and female climacteric states: Secondary | ICD-10-CM | POA: Diagnosis not present

## 2023-02-25 DIAGNOSIS — Z1272 Encounter for screening for malignant neoplasm of vagina: Secondary | ICD-10-CM | POA: Diagnosis not present

## 2023-02-25 DIAGNOSIS — Z01419 Encounter for gynecological examination (general) (routine) without abnormal findings: Secondary | ICD-10-CM | POA: Diagnosis not present

## 2023-03-17 ENCOUNTER — Other Ambulatory Visit: Payer: Self-pay | Admitting: Internal Medicine

## 2023-03-18 ENCOUNTER — Encounter: Payer: Self-pay | Admitting: Internal Medicine

## 2023-03-22 DIAGNOSIS — N958 Other specified menopausal and perimenopausal disorders: Secondary | ICD-10-CM | POA: Diagnosis not present

## 2023-03-25 DIAGNOSIS — Z860100 Personal history of colon polyps, unspecified: Secondary | ICD-10-CM | POA: Diagnosis not present

## 2023-03-25 DIAGNOSIS — K648 Other hemorrhoids: Secondary | ICD-10-CM | POA: Diagnosis not present

## 2023-03-25 DIAGNOSIS — Z800 Family history of malignant neoplasm of digestive organs: Secondary | ICD-10-CM | POA: Diagnosis not present

## 2023-03-25 DIAGNOSIS — Z09 Encounter for follow-up examination after completed treatment for conditions other than malignant neoplasm: Secondary | ICD-10-CM | POA: Diagnosis not present

## 2023-03-25 DIAGNOSIS — K5730 Diverticulosis of large intestine without perforation or abscess without bleeding: Secondary | ICD-10-CM | POA: Diagnosis not present

## 2023-03-25 LAB — HM COLONOSCOPY

## 2023-04-04 ENCOUNTER — Other Ambulatory Visit: Payer: Self-pay | Admitting: Internal Medicine

## 2023-04-04 DIAGNOSIS — Z1231 Encounter for screening mammogram for malignant neoplasm of breast: Secondary | ICD-10-CM

## 2023-04-13 DIAGNOSIS — H25813 Combined forms of age-related cataract, bilateral: Secondary | ICD-10-CM | POA: Diagnosis not present

## 2023-04-13 DIAGNOSIS — H04222 Epiphora due to insufficient drainage, left lacrimal gland: Secondary | ICD-10-CM | POA: Diagnosis not present

## 2023-04-20 ENCOUNTER — Other Ambulatory Visit: Payer: Self-pay | Admitting: Internal Medicine

## 2023-05-23 DIAGNOSIS — L608 Other nail disorders: Secondary | ICD-10-CM | POA: Diagnosis not present

## 2023-05-23 DIAGNOSIS — Z4789 Encounter for other orthopedic aftercare: Secondary | ICD-10-CM | POA: Diagnosis not present

## 2023-05-23 DIAGNOSIS — M79642 Pain in left hand: Secondary | ICD-10-CM | POA: Diagnosis not present

## 2023-05-25 DIAGNOSIS — Z85828 Personal history of other malignant neoplasm of skin: Secondary | ICD-10-CM | POA: Diagnosis not present

## 2023-05-25 DIAGNOSIS — L821 Other seborrheic keratosis: Secondary | ICD-10-CM | POA: Diagnosis not present

## 2023-05-25 DIAGNOSIS — D2262 Melanocytic nevi of left upper limb, including shoulder: Secondary | ICD-10-CM | POA: Diagnosis not present

## 2023-05-25 DIAGNOSIS — L570 Actinic keratosis: Secondary | ICD-10-CM | POA: Diagnosis not present

## 2023-05-25 DIAGNOSIS — Z85820 Personal history of malignant melanoma of skin: Secondary | ICD-10-CM | POA: Diagnosis not present

## 2023-05-25 DIAGNOSIS — L820 Inflamed seborrheic keratosis: Secondary | ICD-10-CM | POA: Diagnosis not present

## 2023-06-09 ENCOUNTER — Ambulatory Visit: Payer: Medicare Other

## 2023-06-10 ENCOUNTER — Ambulatory Visit
Admission: RE | Admit: 2023-06-10 | Discharge: 2023-06-10 | Disposition: A | Discharge status: Home / Self Care | Payer: Medicare Other | Source: Ambulatory Visit | Attending: Internal Medicine

## 2023-06-10 DIAGNOSIS — Z1231 Encounter for screening mammogram for malignant neoplasm of breast: Secondary | ICD-10-CM

## 2023-06-13 DIAGNOSIS — L608 Other nail disorders: Secondary | ICD-10-CM | POA: Diagnosis not present

## 2023-06-13 DIAGNOSIS — M79642 Pain in left hand: Secondary | ICD-10-CM | POA: Diagnosis not present

## 2023-06-13 DIAGNOSIS — S60941D Unspecified superficial injury of left index finger, subsequent encounter: Secondary | ICD-10-CM | POA: Diagnosis not present

## 2023-06-13 DIAGNOSIS — Z4789 Encounter for other orthopedic aftercare: Secondary | ICD-10-CM | POA: Diagnosis not present

## 2023-06-14 ENCOUNTER — Other Ambulatory Visit: Payer: Self-pay | Admitting: Internal Medicine

## 2023-06-14 DIAGNOSIS — N6489 Other specified disorders of breast: Secondary | ICD-10-CM

## 2023-06-27 ENCOUNTER — Ambulatory Visit: Payer: Medicare Other

## 2023-06-27 ENCOUNTER — Ambulatory Visit
Admission: RE | Admit: 2023-06-27 | Discharge: 2023-06-27 | Disposition: A | Discharge status: Home / Self Care | Payer: Medicare Other | Source: Ambulatory Visit | Attending: Internal Medicine

## 2023-06-27 DIAGNOSIS — R92332 Mammographic heterogeneous density, left breast: Secondary | ICD-10-CM | POA: Diagnosis not present

## 2023-06-27 DIAGNOSIS — N6489 Other specified disorders of breast: Secondary | ICD-10-CM | POA: Diagnosis not present

## 2023-07-07 ENCOUNTER — Encounter: Payer: Self-pay | Admitting: Internal Medicine

## 2023-07-07 NOTE — Progress Notes (Unsigned)
    Subjective:    Patient ID: Ashley Norton, female    DOB: [birth year], 68 y.o.   MRN: [id number]      HPI Ashley Norton is here for No chief complaint on file.   Indigestion, nausea:     Medications and allergies reviewed with patient and updated if appropriate.  Current Outpatient Medications on File Prior to Visit  Medication Sig Dispense Refill   ALPRAZolam (XANAX) 0.25 MG tablet TAKE 1 TABLET BY MOUTH 2 TIMES DAILY AS NEEDED. 30 tablet 2   ascorbic acid (VITAMIN C) 1000 MG tablet Vitamin C 1,000 mg tablet     buPROPion (WELLBUTRIN XL) 150 MG 24 hr tablet TAKE 1 TABLET BY MOUTH AT BEDTIME 90 tablet 3   fluticasone (FLONASE) 50 MCG/ACT nasal spray Place 1 spray into both nostrils daily. 15.8 mL 0   Loratadine (CLARITIN PO) Take by mouth.     magic mouthwash (nystatin, lidocaine, diphenhydrAMINE) suspension Take 5 mLs by mouth 4 (four) times daily as needed for mouth pain. 180 mL 0   methocarbamol (ROBAXIN) 500 MG tablet Take 1 tablet (500 mg total) by mouth 2 (two) times daily as needed for muscle spasms. 20 tablet 0   Multiple Vitamins-Minerals (MULTI FOR HER PO) Take by mouth.     ondansetron (ZOFRAN) 4 MG tablet Take 1 tablet (4 mg total) by mouth every 6 (six) hours as needed for nausea or vomiting. 18 tablet 0   ondansetron (ZOFRAN-ODT) 8 MG disintegrating tablet Take 1 tablet (8 mg total) by mouth every 8 (eight) hours as needed for nausea or vomiting. 10 tablet 0   pantoprazole (PROTONIX) 40 MG tablet 1 tablet     pravastatin (PRAVACHOL) 10 MG tablet Take 1 tablet (10 mg total) by mouth daily. 30 tablet 5   RETIN-A 0.05 % cream Apply topically at bedtime.     traMADol (ULTRAM) 50 MG tablet Take 50 mg by mouth every 4 (four) hours as needed.     Vitamin D, Ergocalciferol, (DRISDOL) 50000 units CAPS capsule Take 50,000 Units by mouth 2 (two) times a week.     Zinc 30 MG TABS Take by mouth.     zolpidem (AMBIEN) 10 MG tablet TAKE 1/2 TABLET BY MOUTH AT BEDTIME AS NEEDED FOR  SLEEP 30 tablet 5   [DISCONTINUED] gabapentin (NEURONTIN) 300 MG capsule Take 1 capsule (300 mg total) by mouth at bedtime. (Patient not taking: Reported on 05/13/2015) 90 capsule 4   No current facility-administered medications on file prior to visit.    Review of Systems     Objective:  There were no vitals filed for this visit. BP Readings from Last 3 Encounters:  02/21/23 118/60  01/24/23 120/80  01/15/23 139/61   Wt Readings from Last 3 Encounters:  02/21/23 159 lb (72.1 kg)  01/24/23 159 lb (72.1 kg)  09/28/22 157 lb (71.2 kg)   There is no height or weight on file to calculate BMI.    Physical Exam         Assessment & Plan:    See Problem List for Assessment and Plan of chronic medical problems.

## 2023-07-08 ENCOUNTER — Encounter: Payer: Self-pay | Admitting: Internal Medicine

## 2023-07-08 ENCOUNTER — Ambulatory Visit: Payer: Medicare Other | Admitting: Internal Medicine

## 2023-07-08 VITALS — BP 122/72 | HR 77 | Ht 65.0 in | Wt 157.8 lb

## 2023-07-08 DIAGNOSIS — H9193 Unspecified hearing loss, bilateral: Secondary | ICD-10-CM | POA: Diagnosis not present

## 2023-07-08 DIAGNOSIS — H9319 Tinnitus, unspecified ear: Secondary | ICD-10-CM | POA: Insufficient documentation

## 2023-07-08 DIAGNOSIS — K219 Gastro-esophageal reflux disease without esophagitis: Secondary | ICD-10-CM | POA: Insufficient documentation

## 2023-07-08 DIAGNOSIS — R110 Nausea: Secondary | ICD-10-CM

## 2023-07-08 DIAGNOSIS — H9313 Tinnitus, bilateral: Secondary | ICD-10-CM

## 2023-07-08 DIAGNOSIS — L989 Disorder of the skin and subcutaneous tissue, unspecified: Secondary | ICD-10-CM | POA: Insufficient documentation

## 2023-07-08 MED ORDER — ONDANSETRON HCL 4 MG PO TABS: 4.0000 mg | ORAL_TABLET | Freq: Four times a day (QID) | ORAL | 1 refills | Status: DC | PRN

## 2023-07-08 MED ORDER — OMEPRAZOLE 20 MG PO CPDR: 20.0000 mg | DELAYED_RELEASE_CAPSULE | Freq: Every day | ORAL | 5 refills | Status: DC | PRN

## 2023-07-08 NOTE — Assessment & Plan Note (Signed)
 Acute B/l ? Related to hearing loss No other obvious cause Ear exam normal Referral to audiology

## 2023-07-08 NOTE — Assessment & Plan Note (Signed)
 Erythematous small area on skin, slightly raised, no other lesions Not shingles ? Cause Symptomatic treatment monitor

## 2023-07-08 NOTE — Assessment & Plan Note (Signed)
 Increased nausea Some gerd Nausea likely related to gerd Zofran 4 mg q 8 hr prn Start omeprazole 20 mg daily ac Continue pepcid 20 mg daily

## 2023-07-08 NOTE — Assessment & Plan Note (Signed)
 Acute Increased nausea, some reflux Likely GERD related Just started taking pepcid 20 mg daily  - continue Start omeprazole 20 mg daily ac x 2-3 weeks then stop - can take prn after Zofran 4 mg q 8 hr prn for nausea

## 2023-07-08 NOTE — Patient Instructions (Addendum)
        Medications changes include :   omeprazole 20 mg daily - take for 2-3 weeks then stop.  Take 30 minutes prior to a meal.  Zofran as needed.  Continue pepcid for now.      A referral was ordered Audiology and someone will call you to schedule an appointment.     Return if symptoms worsen or fail to improve.

## 2023-07-08 NOTE — Assessment & Plan Note (Signed)
 Has some mild hearing loss ? Cause of tinnitus Referral to audiology

## 2023-07-14 ENCOUNTER — Encounter: Payer: Self-pay | Admitting: Internal Medicine

## 2023-07-14 NOTE — Telephone Encounter (Signed)
 Added rx back to chart.   Does pt need appt for pink eye medication?

## 2023-07-17 MED ORDER — POLYMYXIN B-TRIMETHOPRIM 10000-0.1 UNIT/ML-% OP SOLN: 1.0000 [drp] | OPHTHALMIC | 0 refills | Status: DC

## 2023-07-21 ENCOUNTER — Other Ambulatory Visit: Payer: Self-pay | Admitting: Internal Medicine

## 2023-07-26 ENCOUNTER — Ambulatory Visit (INDEPENDENT_AMBULATORY_CARE_PROVIDER_SITE_OTHER)

## 2023-07-26 VITALS — Ht 65.0 in | Wt 157.0 lb

## 2023-07-26 DIAGNOSIS — Z0000 Encounter for general adult medical examination without abnormal findings: Secondary | ICD-10-CM

## 2023-07-26 NOTE — Patient Instructions (Signed)
 Ashley Norton , Thank you for taking time to come for your Medicare Wellness Visit. I appreciate your ongoing commitment to your health goals. Please review the following plan we discussed and let me know if I can assist you in the future.   Referrals/Orders/Follow-Ups/Clinician Recommendations: It was nice talking to you today.    This is a list of the screening recommended for you and due dates:  Health Maintenance  Topic Date Due   Hepatitis C Screening  Never done   COVID-19 Vaccine (4 - 2024-25 season) 01/09/2023   Medicare Annual Wellness Visit  04/23/2023   Zoster (Shingles) Vaccine (1 of 2) 10/05/2023*   DTaP/Tdap/Td vaccine (2 - Td or Tdap) 08/10/2023   Mammogram  06/09/2025   Colon Cancer Screening  03/24/2033   Pneumonia Vaccine  Completed   Flu Shot  Completed   DEXA scan (bone density measurement)  Completed   HPV Vaccine  Aged Out  *Topic was postponed. The date shown is not the original due date.    Advanced directives: (Copy Requested) Please bring a copy of your health care power of attorney and living will to the office to be added to your chart at your convenience. You can mail to [facility] [address] [place], [place] [address] or email to [email or web address]  Next Medicare Annual Wellness Visit scheduled for next year: Yes

## 2023-07-26 NOTE — Progress Notes (Signed)
 Subjective:   Ashley Norton is a 68 y.o. who presents for a Medicare Wellness preventive visit.  Visit Complete: Virtual I connected with  Ashley Norton on 07/26/23 by a video and audio enabled telemedicine application and verified that I am speaking with the correct person using two identifiers.  Patient Location: Home  Provider Location: Home Office  I discussed the limitations of evaluation and management by telemedicine. The patient expressed understanding and agreed to proceed.  Vital Signs: Because this visit was a virtual/telehealth visit, some criteria may be missing or patient reported. Any vitals not documented were not able to be obtained and vitals that have been documented are patient reported.   Persons Participating in Visit: Patient.  AWV Questionnaire: No: Patient Medicare AWV questionnaire was not completed prior to this visit.  Cardiac Risk Factors include: dyslipidemia;family history of premature cardiovascular disease     Objective:    Today's Vitals   07/26/23 1448  Weight: 157 lb (71.2 kg)  Height: 5\' 5"  (1.651 m)  PainSc: 7    Body mass index is 26.13 kg/m.     07/26/2023    3:08 PM 01/15/2023    3:57 PM 04/27/2022    2:29 PM 04/22/2022    8:48 AM 05/13/2015    4:51 PM  Advanced Directives  Does Patient Have a Medical Advance Directive? Yes No No Yes No  Type of Estate agent of [place];Living will   Healthcare Power of [place];Living will   Does patient want to make changes to medical advance directive?   No - Patient declined No - Patient declined   Copy of Healthcare Power of Attorney in Chart? No - copy requested   No - copy requested   Would patient like information on creating a medical advance directive?  No - Patient declined No - Patient declined  No - patient declined information    Current Medications (verified) Outpatient Encounter Medications as of 07/26/2023  Medication Sig   ALPRAZolam (XANAX) 0.25  MG tablet Take 0.25 mg by mouth at bedtime as needed for anxiety.   ascorbic acid (VITAMIN C) 1000 MG tablet Vitamin C 1,000 mg tablet   buPROPion (WELLBUTRIN XL) 150 MG 24 hr tablet TAKE 1 TABLET BY MOUTH AT BEDTIME   cholecalciferol (VITAMIN D3) 25 MCG (1000 UNIT) tablet Take 1,000 Units by mouth daily. 2 tablets a day   fluticasone (FLONASE) 50 MCG/ACT nasal spray Place 1 spray into both nostrils daily.   Loratadine (CLARITIN PO) Take by mouth.   methocarbamol (ROBAXIN) 500 MG tablet Take 1 tablet (500 mg total) by mouth 2 (two) times daily as needed for muscle spasms.   Multiple Vitamins-Minerals (MULTI FOR HER PO) Take by mouth.   omeprazole (PRILOSEC) 20 MG capsule Take 1 capsule (20 mg total) by mouth daily as needed. Take 30 minutes prior to a meal   ondansetron (ZOFRAN) 4 MG tablet Take 1 tablet (4 mg total) by mouth every 6 (six) hours as needed for nausea or vomiting.   RETIN-A 0.05 % cream Apply topically at bedtime.   traMADol (ULTRAM) 50 MG tablet Take 50 mg by mouth every 4 (four) hours as needed.   trimethoprim-polymyxin b (POLYTRIM) ophthalmic solution Place 1 drop into the right eye every 4 (four) hours.   Vitamin D, Ergocalciferol, (DRISDOL) 50000 units CAPS capsule Take 50,000 Units by mouth every 7 (seven) days. 2 tablets   Zinc 30 MG TABS Take by mouth.   zolpidem (AMBIEN) 10 MG  tablet TAKE 1/2 TABLET BY MOUTH AT BEDTIME AS NEEDED FOR SLEEP   pravastatin (PRAVACHOL) 10 MG tablet TAKE 1 TABLET BY MOUTH DAILY (Patient not taking: Reported on 07/26/2023)   [DISCONTINUED] gabapentin (NEURONTIN) 300 MG capsule Take 1 capsule (300 mg total) by mouth at bedtime. (Patient not taking: Reported on 05/13/2015)   No facility-administered encounter medications on file as of 07/26/2023.    Allergies (verified) Other, Atorvastatin, Crestor [rosuvastatin], and Penicillins   History: Past Medical History:  Diagnosis Date   Breast cancer (HCC) 2009   Right   COVID 2020   Personal  history of radiation therapy 2009   Uterine cancer (HCC)    At age 1   Past Surgical History:  Procedure Laterality Date   BREAST LUMPECTOMY Right 2009   CHOLECYSTECTOMY     FINGER SURGERY Left    Family History  Problem Relation Age of Onset   Hypertension Mother    Dementia Mother    Colon cancer Father    Diabetes Brother    Diabetes Brother    Social History   Socioeconomic History   Marital status: Widowed    Spouse name: Not on file   Number of children: 2   Years of education: Not on file   Highest education level: 12th grade  Occupational History   Not on file  Tobacco Use   Smoking status: Never   Smokeless tobacco: Never  Vaping Use   Vaping status: Never Used  Substance and Sexual Activity   Alcohol use: Yes    Comment: Occasionally   Drug use: No   Sexual activity: Not on file  Other Topics Concern   Not on file  Social History Narrative   Lives alone   Social Drivers of Health   Financial Resource Strain: Low Risk  (07/26/2023)   Overall Financial Resource Strain (CARDIA)    Difficulty of Paying Living Expenses: Not hard at all  Food Insecurity: No Food Insecurity (07/26/2023)   Hunger Vital Sign    Worried About Running Out of Food in the Last Year: Never true    Ran Out of Food in the Last Year: Never true  Transportation Needs: No Transportation Needs (07/26/2023)   PRAPARE - Administrator, Civil Service (Medical): No    Lack of Transportation (Non-Medical): No  Physical Activity: Insufficiently Active (07/26/2023)   Exercise Vital Sign    Days of Exercise per Week: 2 days    Minutes of Exercise per Session: 20 min  Stress: Stress Concern Present (07/26/2023)   [employer] of Occupational Health - Occupational Stress Questionnaire    Feeling of Stress : To some extent  Social Connections: Moderately Integrated (07/26/2023)   Social Connection and Isolation Panel [NHANES]    Frequency of Communication with Friends and  Family: More than three times a week    Frequency of Social Gatherings with Friends and Family: Three times a week    Attends Religious Services: More than 4 times per year    Active Member of Clubs or Organizations: Yes    Attends Banker Meetings: More than 4 times per year    Marital Status: Widowed    Tobacco Counseling Counseling given: Not Answered    Clinical Intake:  Pre-visit preparation completed: Yes  Pain : 0-10 Pain Score: 7  Pain Type: Chronic pain Pain Location: Back (into lt hip and down leg some) Pain Orientation: Lower Pain Radiating Towards: Lt hip and leg Pain Descriptors / Indicators:  Aching, Discomfort Pain Onset: 1 to 4 weeks ago Pain Frequency: Constant Pain Relieving Factors: Roboxin and Ibprofen  Pain Relieving Factors: Roboxin and Ibprofen  BMI - recorded: 26.13 Nutritional Status: BMI 25 -29 Overweight Nutritional Risks: Nausea/ vomitting/ diarrhea Diabetes: No        Information entered by :: Makih Stefanko, RMA   Activities of Daily Living     07/26/2023    2:48 PM  In your present state of health, do you have any difficulty performing the following activities:  Hearing? 0  Comment some ringing in ears-per pt  Vision? 0  Difficulty concentrating or making decisions? 0  Walking or climbing stairs? 0  Dressing or bathing? 0  Doing errands, shopping? 0  Preparing Food and eating ? N  Using the Toilet? N  In the past six months, have you accidently leaked urine? N  Do you have problems with loss of bowel control? N  Managing your Medications? N  Managing your Finances? N  Housekeeping or managing your Housekeeping? N    Patient Care Team: Pincus Sanes, MD as PCP - General (Internal Medicine) Center, [facility]  Indicate any recent Medical Services you may have received from other than [facility] providers in the past year (date may be approximate).     Assessment:   This is a routine wellness examination  for Keenya.  Hearing/Vision screen Hearing Screening - Comments:: Some ringing in ears-per pt Vision Screening - Comments:: Wears eyeglasses   Goals Addressed             This Visit's Progress    Patient Stated   On track    Lose weight by continuing to walk and eat healthier.        Depression Screen     07/26/2023    3:10 PM 09/28/2022    1:11 PM 06/09/2022    8:01 AM 04/22/2022    8:41 AM 04/13/2022   10:39 AM 01/18/2022   12:59 PM  PHQ 2/9 Scores  PHQ - 2 Score 0 1 0 0 0 0  PHQ- 9 Score 4 3   0     Fall Risk     07/26/2023    3:08 PM 09/28/2022    1:11 PM 06/09/2022    8:01 AM 05/05/2022    8:19 AM 04/22/2022    8:50 AM  Fall Risk   Falls in the past year? 0 0 0  0  Number falls in past yr: 0 0 0 0 0  Injury with Fall? 0 0 0 0 0  Risk for fall due to : No Fall Risks No Fall Risks No Fall Risks No Fall Risks No Fall Risks  Follow up Falls prevention discussed;Falls evaluation completed Falls evaluation completed Falls evaluation completed Falls evaluation completed Falls evaluation completed    MEDICARE RISK AT HOME:  Medicare Risk at Home Any stairs in or around the home?: No If so, are there any without handrails?: No Home free of loose throw rugs in walkways, pet beds, electrical cords, etc?: Yes Adequate lighting in your home to reduce risk of falls?: Yes Life alert?: No Use of a cane, walker or w/c?: No Grab bars in the bathroom?: Yes Shower chair or bench in shower?: Yes Elevated toilet seat or a handicapped toilet?: Yes  TIMED UP AND GO:  Was the test performed?  No  Cognitive Function: Normal: Normal cognitive status assessed by direct observation by this Clinical Health Advisor. No abnormalities found. Patient is  able to answer questions in an accurate and timely manner.        04/22/2022    8:48 AM  6CIT Screen  What Year? 0 points  What month? 0 points  What time? 0 points  Count back from 20 0 points  Months in reverse 0 points   Repeat phrase 0 points  Total Score 0 points    Immunizations Immunization History  Administered Date(s) Administered   Fluad Trivalent(High Dose 65+) 01/24/2023   Influenza-Unspecified 02/02/2020, 03/02/2022   PFIZER(Purple Top)SARS-COV-2 Vaccination 08/01/2019, 08/23/2019   PNEUMOCOCCAL CONJUGATE-20 03/15/2023   Tdap 08/09/2013   Unspecified SARS-COV-2 Vaccination 08/23/2019    Screening Tests Health Maintenance  Topic Date Due   Hepatitis C Screening  Never done   COVID-19 Vaccine (4 - 2024-25 season) 01/09/2023   Medicare Annual Wellness (AWV)  04/23/2023   Zoster Vaccines- Shingrix (1 of 2) 10/05/2023 (Originally 11/14/1974)   DTaP/Tdap/Td (2 - Td or Tdap) 08/10/2023   MAMMOGRAM  06/09/2025   Colonoscopy  03/24/2033   Pneumonia Vaccine 51+ Years old  Completed   INFLUENZA VACCINE  Completed   DEXA SCAN  Completed   HPV VACCINES  Aged Out    Health Maintenance  Health Maintenance Due  Topic Date Due   Hepatitis C Screening  Never done   COVID-19 Vaccine (4 - 2024-25 season) 01/09/2023   Medicare Annual Wellness (AWV)  04/23/2023   Health Maintenance Items Addressed: See Nurse Notes  Additional Screening:  Vision Screening: Recommended annual ophthalmology exams for early detection of glaucoma and other disorders of the eye.  Dental Screening: Recommended annual dental exams for proper oral hygiene  Community Resource Referral / Chronic Care Management: CRR required this visit?  No   CCM required this visit?  No     Plan:     I have personally reviewed and noted the following in the patient's chart:   Medical and social history Use of alcohol, tobacco or illicit drugs  Current medications and supplements including opioid prescriptions. Patient is not currently taking opioid prescriptions. Functional ability and status Nutritional status Physical activity Advanced directives List of other physicians Hospitalizations, surgeries, and ER visits in  previous 12 months Vitals Screenings to include cognitive, depression, and falls Referrals and appointments  In addition, I have reviewed and discussed with patient certain preventive protocols, quality metrics, and best practice recommendations. A written personalized care plan for preventive services as well as general preventive health recommendations were provided to patient.     Bernisha Verma L Romeo Zielinski, CMA   07/26/2023   After Visit Summary: (MyChart) Due to this being a telephonic visit, the after visit summary with patients personalized plan was offered to patient via MyChart   Notes: Please refer to Routing Comments.

## 2023-07-28 DIAGNOSIS — H903 Sensorineural hearing loss, bilateral: Secondary | ICD-10-CM | POA: Diagnosis not present

## 2023-07-30 ENCOUNTER — Encounter: Payer: Self-pay | Admitting: Internal Medicine

## 2023-08-02 DIAGNOSIS — L570 Actinic keratosis: Secondary | ICD-10-CM | POA: Diagnosis not present

## 2023-08-02 DIAGNOSIS — L821 Other seborrheic keratosis: Secondary | ICD-10-CM | POA: Diagnosis not present

## 2023-08-02 DIAGNOSIS — Z85820 Personal history of malignant melanoma of skin: Secondary | ICD-10-CM | POA: Diagnosis not present

## 2023-08-02 DIAGNOSIS — Z85828 Personal history of other malignant neoplasm of skin: Secondary | ICD-10-CM | POA: Diagnosis not present

## 2023-08-03 DIAGNOSIS — M545 Low back pain, unspecified: Secondary | ICD-10-CM | POA: Diagnosis not present

## 2023-08-10 DIAGNOSIS — L820 Inflamed seborrheic keratosis: Secondary | ICD-10-CM | POA: Diagnosis not present

## 2023-08-10 DIAGNOSIS — L821 Other seborrheic keratosis: Secondary | ICD-10-CM | POA: Diagnosis not present

## 2023-08-21 ENCOUNTER — Other Ambulatory Visit: Payer: Self-pay | Admitting: Internal Medicine

## 2023-09-05 DIAGNOSIS — M545 Low back pain, unspecified: Secondary | ICD-10-CM | POA: Diagnosis not present

## 2023-09-07 ENCOUNTER — Encounter: Payer: Self-pay | Admitting: Internal Medicine

## 2023-09-07 NOTE — Progress Notes (Signed)
 Subjective:    Patient ID: Ashley Norton, female    DOB: 1955-05-29, 68 y.o.   MRN: [id number]      HPI Ashley Norton is here for  Chief Complaint  Patient presents with   Nasal Congestion    Nasal and chest congestion; Cough (started on Sunday) Somewhat productive cough; 2 negative home test for COVID/flu also (last one was on Tuesday)    She is here for an acute visit for cold symptoms.   Her symptoms started about one week ago  She is experiencing fatigue, nasal congestion, sore throat, cough and headaches.  She did have some shortness of breath 2 nights ago.  She was concerned because she was starting to develop some chest congestion.  She just returned from traveling.  She denies any fevers, sinus pain, wheezing or dizziness.  She has tried taking taking cold and sinus medication - sudafed.  Prescription cough syrup tylenol , cough drops.  She is only taking 12-hour Sudafed-if she takes it twice a day it does tend to affect her sleep.  Home tests for covid and flu were negative x 2.     Medications and allergies reviewed with patient and updated if appropriate.  Current Outpatient Medications on File Prior to Visit  Medication Sig Dispense Refill   ALPRAZolam  (XANAX ) 0.25 MG tablet Take 0.25 mg by mouth at bedtime as needed for anxiety.     ascorbic acid (VITAMIN C) 1000 MG tablet Vitamin C 1,000 mg tablet     buPROPion  (WELLBUTRIN  XL) 150 MG 24 hr tablet TAKE 1 TABLET BY MOUTH AT BEDTIME 90 tablet 3   cholecalciferol (VITAMIN D3) 25 MCG (1000 UNIT) tablet Take 1,000 Units by mouth daily. 2 tablets a day     fluticasone  (FLONASE ) 50 MCG/ACT nasal spray Place 1 spray into both nostrils daily. 15.8 mL 0   Loratadine (CLARITIN PO) Take by mouth.     methocarbamol  (ROBAXIN ) 500 MG tablet Take 1 tablet (500 mg total) by mouth 2 (two) times daily as needed for muscle spasms. 20 tablet 0   Multiple Vitamins-Minerals (MULTI FOR HER PO) Take by mouth.     omeprazole  (PRILOSEC)  20 MG capsule Take 1 capsule (20 mg total) by mouth daily as needed. Take 30 minutes prior to a meal 30 capsule 5   ondansetron  (ZOFRAN ) 4 MG tablet Take 1 tablet (4 mg total) by mouth every 6 (six) hours as needed for nausea or vomiting. 20 tablet 1   RETIN-A 0.05 % cream Apply topically at bedtime.     traMADol (ULTRAM) 50 MG tablet Take 50 mg by mouth every 4 (four) hours as needed.     trimethoprim -polymyxin b  (POLYTRIM ) ophthalmic solution Place 1 drop into the right eye every 4 (four) hours. 10 mL 0   Vitamin D , Ergocalciferol , (DRISDOL) 50000 units CAPS capsule Take 50,000 Units by mouth every 7 (seven) days. 2 tablets     Zinc 30 MG TABS Take by mouth.     zolpidem  (AMBIEN ) 10 MG tablet TAKE 1/2 TABLET BY MOUTH AT BEDTIME AS NEEDED FOR SLEEP 30 tablet 5   [DISCONTINUED] gabapentin  (NEURONTIN ) 300 MG capsule Take 1 capsule (300 mg total) by mouth at bedtime. (Patient not taking: Reported on 05/13/2015) 90 capsule 4   No current facility-administered medications on file prior to visit.    Review of Systems  Constitutional:  Positive for fatigue. Negative for chills and fever.  HENT:  Positive for congestion and sore throat. Negative for ear  pain and sinus pain.   Respiratory:  Positive for cough and shortness of breath (two nights ago). Negative for wheezing.   Gastrointestinal:  Positive for diarrhea. Negative for nausea.  Musculoskeletal:  Positive for myalgias.  Neurological:  Positive for headaches. Negative for dizziness and light-headedness.       Objective:   Vitals:   09/08/23 1004  BP: 110/74  Pulse: 68  Temp: 98.2 F (36.8 C)  SpO2: 96%   BP Readings from Last 3 Encounters:  09/08/23 110/74  07/08/23 122/72  02/21/23 118/60   Wt Readings from Last 3 Encounters:  09/08/23 158 lb (71.7 kg)  07/26/23 157 lb (71.2 kg)  07/08/23 157 lb 12.8 oz (71.6 kg)   Body mass index is 26.29 kg/m.    Physical Exam Constitutional:      General: She is not in acute  distress.    Appearance: Normal appearance. She is not ill-appearing.  HENT:     Head: Normocephalic and atraumatic.     Right Ear: Tympanic membrane, ear canal and external ear normal.     Left Ear: Tympanic membrane, ear canal and external ear normal.     Mouth/Throat:     Mouth: Mucous membranes are moist.     Pharynx: No oropharyngeal exudate or posterior oropharyngeal erythema.  Eyes:     Conjunctiva/sclera: Conjunctivae normal.  Cardiovascular:     Rate and Rhythm: Normal rate and regular rhythm.  Pulmonary:     Effort: Pulmonary effort is normal. No respiratory distress.     Breath sounds: Normal breath sounds. No wheezing or rales.  Musculoskeletal:     Cervical back: Neck supple. No tenderness.  Lymphadenopathy:     Cervical: No cervical adenopathy.  Skin:    General: Skin is warm and dry.  Neurological:     Mental Status: She is alert.            Assessment & Plan:    See Problem List for Assessment and Plan of chronic medical problems.

## 2023-09-07 NOTE — Patient Instructions (Addendum)
         Medications changes include :   tussionex cough syrup,  continue sudafed, start flonase        Return if symptoms worsen or fail to improve.

## 2023-09-08 ENCOUNTER — Ambulatory Visit: Admitting: Internal Medicine

## 2023-09-08 VITALS — BP 110/74 | HR 68 | Temp 98.2°F | Ht 65.0 in | Wt 158.0 lb

## 2023-09-08 DIAGNOSIS — J069 Acute upper respiratory infection, unspecified: Secondary | ICD-10-CM | POA: Diagnosis not present

## 2023-09-08 MED ORDER — HYDROCOD POLI-CHLORPHE POLI ER 10-8 MG/5ML PO SUER: 5.0000 mL | Freq: Two times a day (BID) | ORAL | 0 refills | Status: DC | PRN

## 2023-09-08 NOTE — Assessment & Plan Note (Addendum)
 Acute Symptoms likely viral in nature Rapid home covid and flu tests negative Tussionex cough syrup prescribed Continue symptomatic treatment with over-the-counter cold medications, Tylenol /ibuprofen  She will try the 24-hour Sudafed-hopefully that will not affect her sleep too much Start Flonase  Increase rest and fluids Call if symptoms worsen or do not improve

## 2023-10-17 ENCOUNTER — Other Ambulatory Visit: Payer: Self-pay | Admitting: Internal Medicine

## 2023-10-31 DIAGNOSIS — K080 Exfoliation of teeth due to systemic causes: Secondary | ICD-10-CM | POA: Diagnosis not present

## 2023-11-13 IMAGING — MG MM DIGITAL SCREENING BILAT W/ TOMO AND CAD
8 series · 9 of 24 positions shown · non-contrast
Comparison: Previous exam(s).

CLINICAL DATA: Screening.

EXAM:
DIGITAL SCREENING BILATERAL MAMMOGRAM WITH TOMOSYNTHESIS AND CAD
TECHNIQUE: Bilateral screening digital craniocaudal and mediolateral oblique
mammograms were obtained. Bilateral screening digital breast
tomosynthesis was performed. The images were evaluated with
computer-aided detection.

[L CC synth-2D]
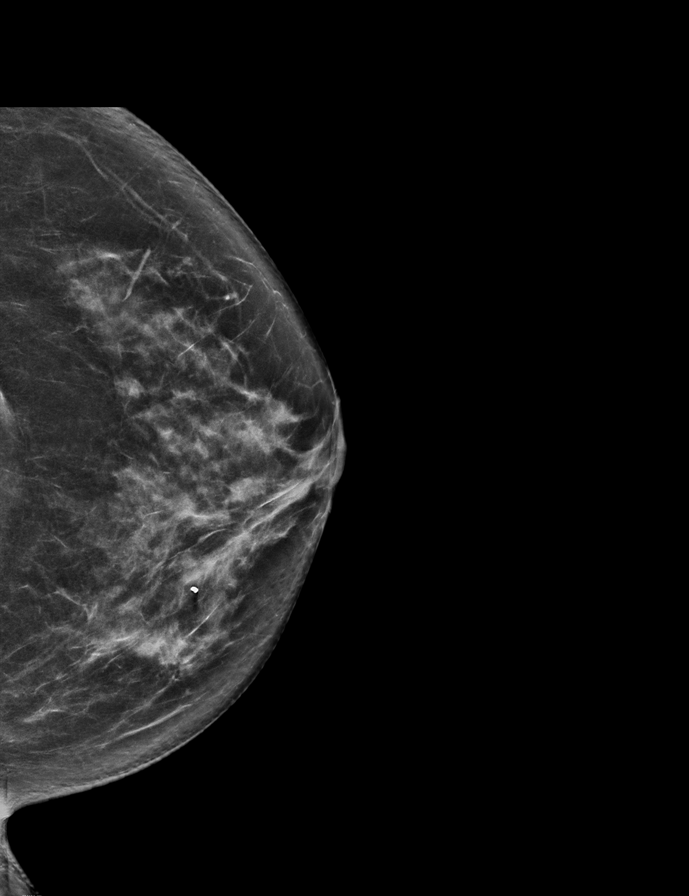

[L MLO synth-2D]
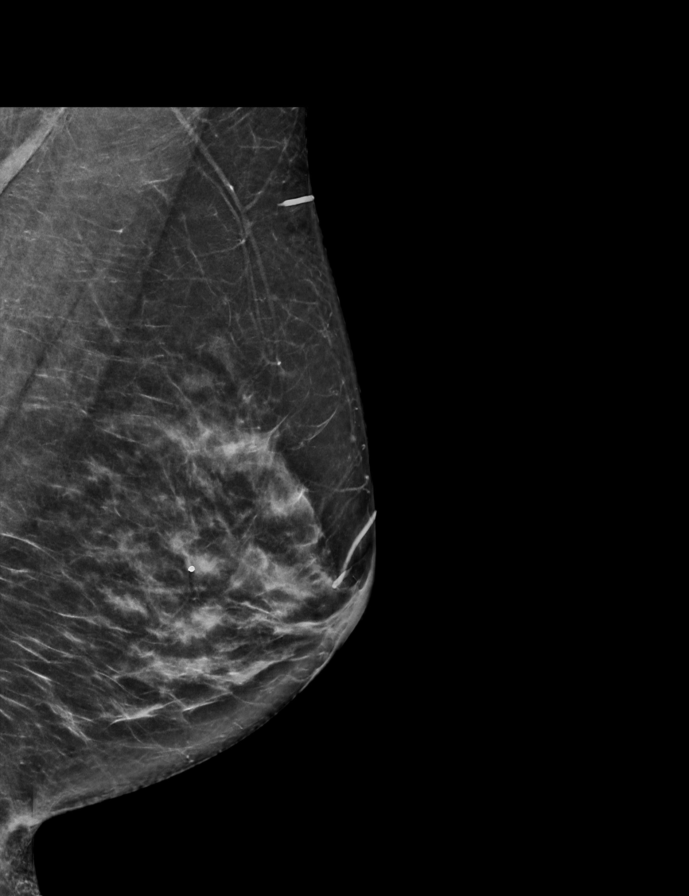

[R CC synth-2D]
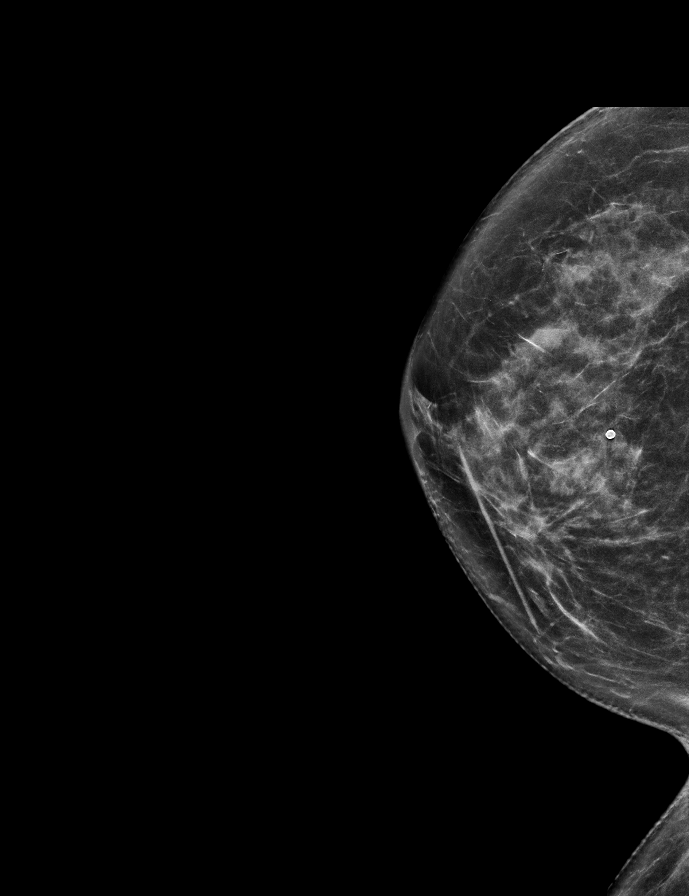

[R MLO synth-2D]
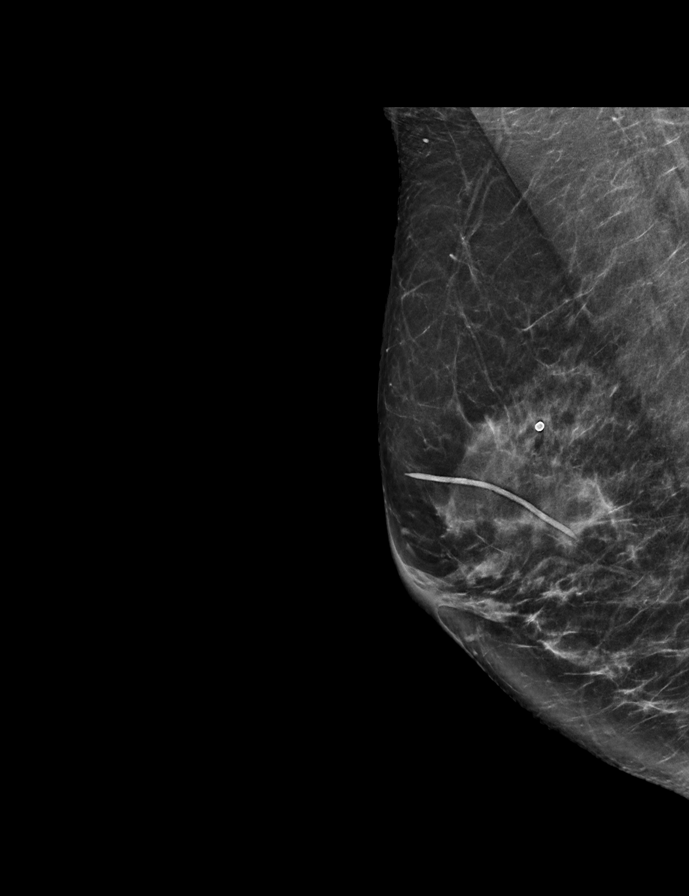

[R CC tomo · 2 of 64 frames shown]
[frame 21/64]
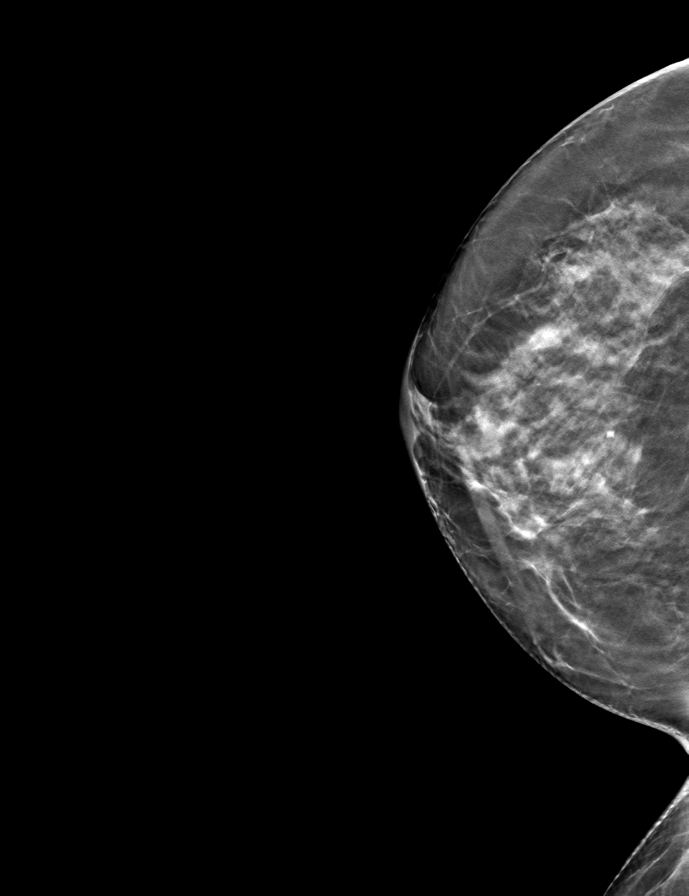
[frame 33/64]
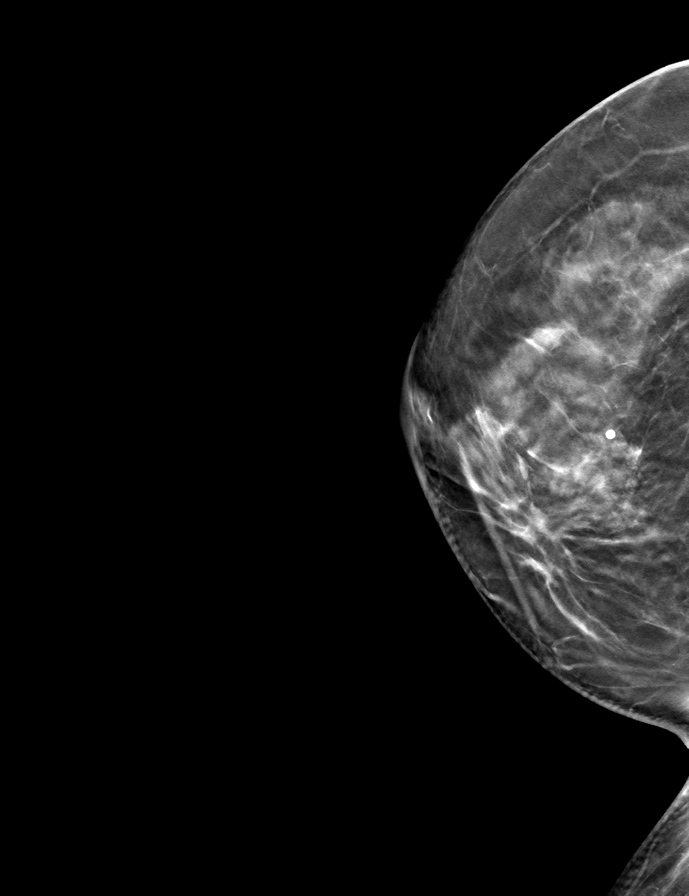

[L MLO tomo · tomo slice 35/69.0]
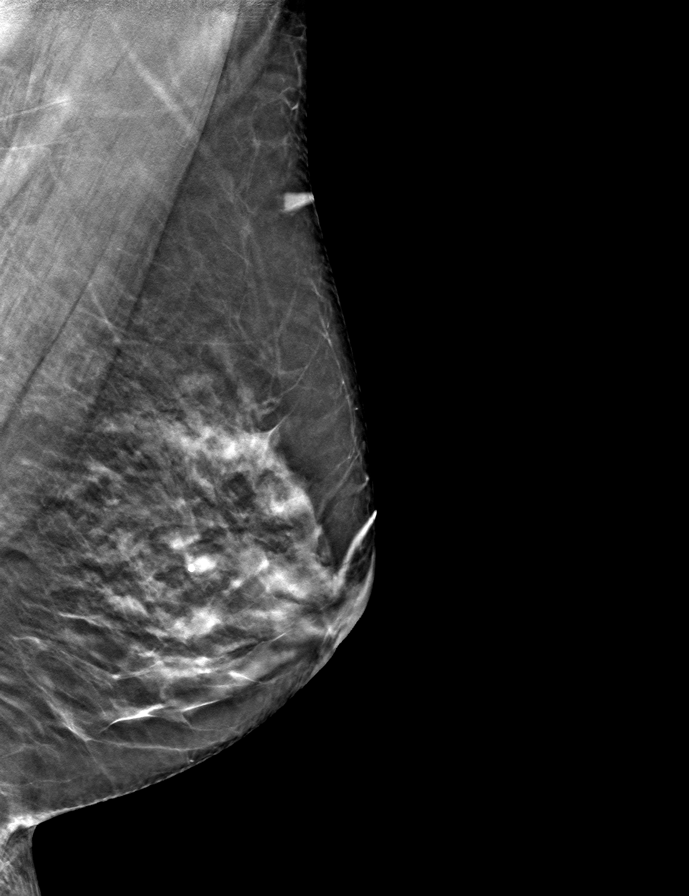

[L CC tomo · tomo slice 34/67.0]
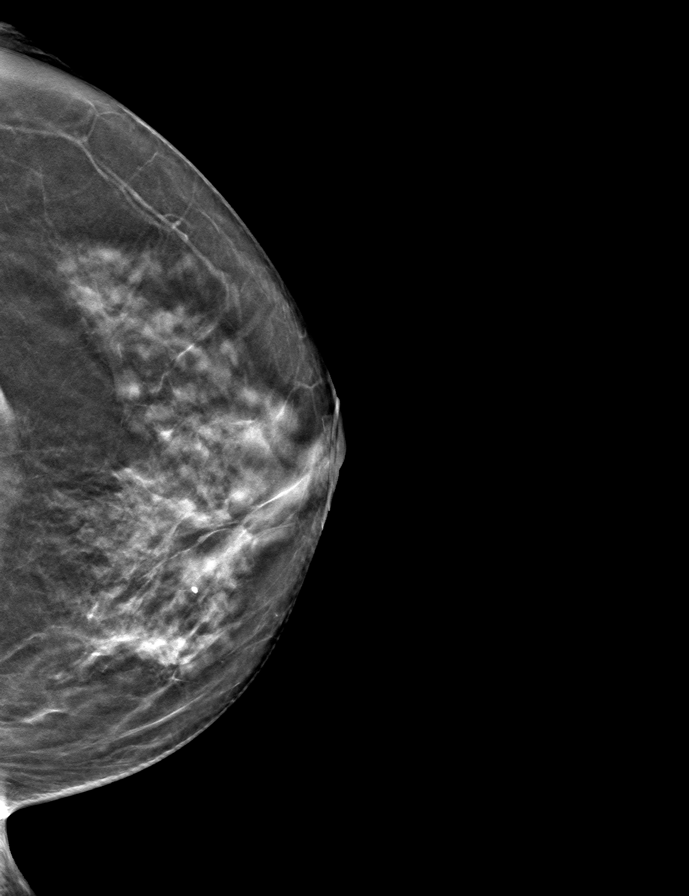

[R MLO tomo · tomo slice 33/64.0]
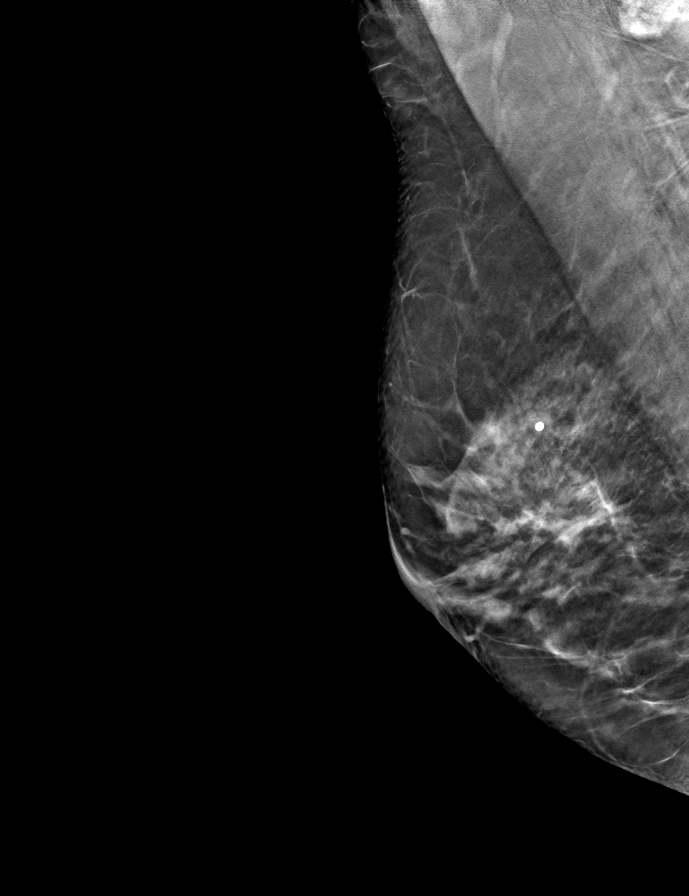

[9 of 24 positions shown; findings below may reference images not displayed]

ACR Breast Density Category c: The breast tissue is heterogeneously
dense, which may obscure small masses.
FINDINGS: There are no findings suspicious for malignancy.
IMPRESSION: No mammographic evidence of malignancy. A result letter of this
screening mammogram will be mailed directly to the patient.

RECOMMENDATION:
Screening mammogram in one year. (Code:[id number])

BI-RADS CATEGORY  1: Negative.

## 2023-11-25 ENCOUNTER — Other Ambulatory Visit: Payer: Self-pay | Admitting: Internal Medicine

## 2023-12-01 DIAGNOSIS — D2262 Melanocytic nevi of left upper limb, including shoulder: Secondary | ICD-10-CM | POA: Diagnosis not present

## 2023-12-01 DIAGNOSIS — L821 Other seborrheic keratosis: Secondary | ICD-10-CM | POA: Diagnosis not present

## 2023-12-01 DIAGNOSIS — Z85828 Personal history of other malignant neoplasm of skin: Secondary | ICD-10-CM | POA: Diagnosis not present

## 2023-12-01 DIAGNOSIS — Z85820 Personal history of malignant melanoma of skin: Secondary | ICD-10-CM | POA: Diagnosis not present

## 2023-12-01 DIAGNOSIS — L570 Actinic keratosis: Secondary | ICD-10-CM | POA: Diagnosis not present

## 2023-12-01 DIAGNOSIS — C44722 Squamous cell carcinoma of skin of right lower limb, including hip: Secondary | ICD-10-CM | POA: Diagnosis not present

## 2023-12-05 ENCOUNTER — Telehealth: Payer: Self-pay

## 2023-12-05 NOTE — Telephone Encounter (Unsigned)
 Copied from CRM [id number]. Topic: Clinical - Medication Refill >> Dec 05, 2023  9:37 AM Harlene ORN wrote: Medication: scopolamine  (TRANSDERM-SCOP) 1 MG/3DAYS  Has the patient contacted their pharmacy? Yes (Agent: If no, request that the patient contact the pharmacy for the refill. If patient does not wish to contact the pharmacy document the reason why and proceed with request.) (Agent: If yes, when and what did the pharmacy advise?)  This is the patient's preferred pharmacy:  [facility] [id number] - [place], [place] - [address] [place] [address] Phone: [phone] Fax: [phone]  Is this the correct pharmacy for this prescription? Yes If no, delete pharmacy and type the correct one.   Has the prescription been filled recently? No  Is the patient out of the medication? Yes  Has the patient been seen for an appointment in the last year OR does the patient have an upcoming appointment? Yes  Can we respond through MyChart? Yes  Agent: Please be advised that Rx refills may take up to 3 business days. We ask that you follow-up with your pharmacy.

## 2023-12-05 NOTE — Telephone Encounter (Unsigned)
 Copied from CRM [id number]. Topic: Clinical - Medication Question >> Dec 05, 2023  4:16 PM Ashley Norton wrote: Reason for CRM: Pt stated that she submitted a medication refill request for the scopolamine  (TRANSDERM-SCOP) 1 MG/3DAYS and it was supposed to be sent as an urgent request. Pt stated that she hasn't received a callback or received an update from the pharmacy. Pt stated that she is leaving out of town on Wednesday and needs the medication approved and sent to the pharmacy tomorrow. Pt would like a callback with an update on this request.

## 2023-12-06 ENCOUNTER — Telehealth: Payer: Self-pay

## 2023-12-06 ENCOUNTER — Other Ambulatory Visit (HOSPITAL_COMMUNITY): Payer: Self-pay

## 2023-12-06 ENCOUNTER — Encounter: Payer: Self-pay | Admitting: Internal Medicine

## 2023-12-06 MED ORDER — SCOPOLAMINE 1 MG/3DAYS TD PT72: 1.0000 | MEDICATED_PATCH | TRANSDERMAL | 0 refills | Status: DC

## 2023-12-06 NOTE — Telephone Encounter (Signed)
 Pharmacy Patient Advocate Encounter   Received notification from CoverMyMeds that prior authorization for Scopolamine  1MG /3DAYS 72 hr patches is required/requested.   Insurance verification completed.   The patient is insured through [facility] .   Per test claim: PA required; PA submitted to above mentioned insurance via CoverMyMeds Key/confirmation #[id number] RODELL Status is pending

## 2023-12-06 NOTE — Telephone Encounter (Signed)
 pending

## 2023-12-06 NOTE — Telephone Encounter (Signed)
 Copied from CRM [id number]. Topic: Clinical - Medical Advice >> Dec 06, 2023 12:17 PM Chasity T wrote: Reason for CRM: Malia of [employer] is calling to advise that the medication scopolamine  (TRANSDERM-SCOP) 1 MG/3DAYS has been approved for prior auth for 1 year starting 12/06/23 until 12/05/2024. She will fax over the information as well.  Call back: [phone] option 5 if call back is needed.

## 2024-01-06 DIAGNOSIS — K080 Exfoliation of teeth due to systemic causes: Secondary | ICD-10-CM | POA: Diagnosis not present

## 2024-01-24 ENCOUNTER — Encounter: Payer: Self-pay | Admitting: Internal Medicine

## 2024-01-24 DIAGNOSIS — G479 Sleep disorder, unspecified: Secondary | ICD-10-CM | POA: Insufficient documentation

## 2024-01-24 NOTE — Progress Notes (Unsigned)
 Subjective:    Patient ID: Ashley Norton, female    DOB: 1955-12-08, 68 y.o.   MRN: [id number]      HPI Ashley Norton is here for a Physical exam and her chronic medical problems.   Have always had to go to the bathroom just after eating.  For one month - has had diarrhea, which is typically related to something she ate, and will take imodium and it helps.  Then gets constipated and takes something for that.  She does get some nausea.  Yesterday morning this occurred- had loose stools pursing in the morning before eating which she thinks may have been related to soup that her neighbor gave her and she ate the night before.  She did feel nauseous.   Took imodium and zofran  and it helped.  She gets constipated frequently.    Difficulty falling asleep and staying asleep.  Her sleep is terrible.  She has been taking one 5 mg Ambien  at night as needed-would take it if she had not slept for couple of nights.  1 night after not sleeping for couple of nights she did take  10 mg and did not go to bed right away and did experience some hallucinations.  She has not taken it since then.  She has difficulty falling asleep and staying asleep.  She can have a couple of nights in a row of not sleeping at all-that her body will crash and she will sleep.   Medications and allergies reviewed with patient and updated if appropriate.  Current Outpatient Medications on File Prior to Visit  Medication Sig Dispense Refill   ascorbic acid (VITAMIN C) 1000 MG tablet Vitamin C 1,000 mg tablet     buPROPion  (WELLBUTRIN  XL) 150 MG 24 hr tablet TAKE 1 TABLET BY MOUTH AT BEDTIME 90 tablet 3   cholecalciferol (VITAMIN D3) 25 MCG (1000 UNIT) tablet Take 1,000 Units by mouth daily. 2 tablets a day     fluticasone  (FLONASE ) 50 MCG/ACT nasal spray Place 1 spray into both nostrils daily. 15.8 mL 0   Loratadine (CLARITIN PO) Take by mouth.     methocarbamol  (ROBAXIN ) 500 MG tablet Take 1 tablet (500 mg total) by mouth 2 (two)  times daily as needed for muscle spasms. 20 tablet 0   Multiple Vitamins-Minerals (MULTI FOR HER PO) Take by mouth.     omeprazole  (PRILOSEC) 20 MG capsule Take 1 capsule (20 mg total) by mouth daily as needed. Take 30 minutes prior to a meal 30 capsule 5   RETIN-A 0.05 % cream Apply topically at bedtime.     traMADol (ULTRAM) 50 MG tablet Take 50 mg by mouth every 4 (four) hours as needed.     Zinc 30 MG TABS Take by mouth.     [DISCONTINUED] gabapentin  (NEURONTIN ) 300 MG capsule Take 1 capsule (300 mg total) by mouth at bedtime. (Patient not taking: Reported on 05/13/2015) 90 capsule 4   No current facility-administered medications on file prior to visit.    Review of Systems  Constitutional:  Negative for fever.  Eyes:  Negative for visual disturbance.  Respiratory:  Negative for cough, shortness of breath and wheezing.   Cardiovascular:  Positive for leg swelling (occ). Negative for chest pain and palpitations.  Gastrointestinal:  Positive for abdominal pain (cramping with diarrhea), constipation, diarrhea and nausea. Negative for blood in stool.       GERD controlled  Genitourinary:  Negative for dysuria.  Musculoskeletal:  Positive for back pain (  chronic intermittent lower back pain). Negative for arthralgias.  Skin:  Negative for rash.  Neurological:  Negative for light-headedness and headaches.  Psychiatric/Behavioral:  Positive for dysphoric mood (at times) and sleep disturbance. The patient is nervous/anxious (at times).        Objective:   Vitals:   01/25/24 0957  BP: 124/80  Pulse: 63  Temp: 98 F (36.7 C)  SpO2: 98%   Filed Weights   01/25/24 0957  Weight: 157 lb (71.2 kg)   Body mass index is 26.13 kg/m.  BP Readings from Last 3 Encounters:  01/25/24 124/80  09/08/23 110/74  07/08/23 122/72    Wt Readings from Last 3 Encounters:  01/25/24 157 lb (71.2 kg)  09/08/23 158 lb (71.7 kg)  07/26/23 157 lb (71.2 kg)       Physical Exam Constitutional: She  appears well-developed and well-nourished. No distress.  HENT:  Head: Normocephalic and atraumatic.  Right Ear: External ear normal. Normal ear canal and TM Left Ear: External ear normal.  Normal ear canal and TM Mouth/Throat: Oropharynx is clear and moist.  Eyes: Conjunctivae normal.  Neck: Neck supple. No tracheal deviation present. No thyromegaly present.  No carotid bruit  Cardiovascular: Normal rate, regular rhythm and normal heart sounds.   No murmur heard.  No edema. Pulmonary/Chest: Effort normal and breath sounds normal. No respiratory distress. She has no wheezes. She has no rales.  Breast: deferred   Abdominal: Soft. She exhibits no distension. There is no tenderness.  Lymphadenopathy: She has no cervical adenopathy.  Skin: Skin is warm and dry. She is not diaphoretic.  Psychiatric: She has a normal mood and affect. Her behavior is normal.     Lab Results  Component Value Date   WBC 5.5 06/09/2022   HGB 13.0 06/09/2022   HCT 37.6 06/09/2022   PLT 268.0 06/09/2022   GLUCOSE 87 06/09/2022   CHOL 209 (H) 01/06/2023   TRIG 140.0 01/06/2023   HDL 47.50 01/06/2023   LDLCALC 134 (H) 01/06/2023   ALT 23 01/06/2023   AST 18 01/06/2023   NA 140 06/09/2022   K 4.0 06/09/2022   CL 104 06/09/2022   CREATININE 0.92 06/09/2022   BUN 11 06/09/2022   CO2 30 06/09/2022   HGBA1C 5.4 06/09/2022         Assessment & Plan:   Physical exam: Screening blood work  ordered Exercise  walking Weight  is good Substance abuse  none   Reviewed recommended immunizations.   Health Maintenance  Topic Date Due   COVID-19 Vaccine (4 - 2025-26 season) 02/10/2024 (Originally 01/09/2024)   Zoster Vaccines- Shingrix (1 of 2) 04/25/2024 (Originally 11/14/1974)   Influenza Vaccine  08/07/2024 (Originally 12/09/2023)   DTaP/Tdap/Td (2 - Td or Tdap) 01/24/2025 (Originally 08/10/2023)   Medicare Annual Wellness (AWV)  07/25/2024   Mammogram  06/09/2025   Colonoscopy  03/24/2033   Pneumococcal  Vaccine: 50+ Years  Completed   DEXA SCAN  Completed   HPV VACCINES  Aged Out   Meningococcal B Vaccine  Aged Out   Hepatitis C Screening  Discontinued          See Problem List for Assessment and Plan of chronic medical problems.

## 2024-01-24 NOTE — Patient Instructions (Addendum)
 Blood work was ordered.       Medications changes include :   start benefiber and a probiotic. Try sonata  for sleep.  Hyoscyamine  for abdominal cramping      Return in about 6 months (around 07/24/2024) for follow up.    Health Maintenance, Female Adopting a healthy lifestyle and getting preventive care are important in promoting health and wellness. Ask your health care provider about: The right schedule for you to have regular tests and exams. Things you can do on your own to prevent diseases and keep yourself healthy. What should I know about diet, weight, and exercise? Eat a healthy diet  Eat a diet that includes plenty of vegetables, fruits, low-fat dairy products, and lean protein. Do not eat a lot of foods that are high in solid fats, added sugars, or sodium. Maintain a healthy weight Body mass index (BMI) is used to identify weight problems. It estimates body fat based on height and weight. Your health care provider can help determine your BMI and help you achieve or maintain a healthy weight. Get regular exercise Get regular exercise. This is one of the most important things you can do for your health. Most adults should: Exercise for at least 150 minutes each week. The exercise should increase your heart rate and make you sweat (moderate-intensity exercise). Do strengthening exercises at least twice a week. This is in addition to the moderate-intensity exercise. Spend less time sitting. Even light physical activity can be beneficial. Watch cholesterol and blood lipids Have your blood tested for lipids and cholesterol at 68 years of age, then have this test every 5 years. Have your cholesterol levels checked more often if: Your lipid or cholesterol levels are high. You are older than 68 years of age. You are at high risk for heart disease. What should I know about cancer screening? Depending on your health history and family history, you may need to have cancer  screening at various ages. This may include screening for: Breast cancer. Cervical cancer. Colorectal cancer. Skin cancer. Lung cancer. What should I know about heart disease, diabetes, and high blood pressure? Blood pressure and heart disease High blood pressure causes heart disease and increases the risk of stroke. This is more likely to develop in people who have high blood pressure readings or are overweight. Have your blood pressure checked: Every 3-5 years if you are 1-51 years of age. Every year if you are 29 years old or older. Diabetes Have regular diabetes screenings. This checks your fasting blood sugar level. Have the screening done: Once every three years after age 46 if you are at a normal weight and have a low risk for diabetes. More often and at a younger age if you are overweight or have a high risk for diabetes. What should I know about preventing infection? Hepatitis B If you have a higher risk for hepatitis B, you should be screened for this virus. Talk with your health care provider to find out if you are at risk for hepatitis B infection. Hepatitis C Testing is recommended for: Everyone born from [birth year] through 1965. Anyone with known risk factors for hepatitis C. Sexually transmitted infections (STIs) Get screened for STIs, including gonorrhea and chlamydia, if: You are sexually active and are younger than 68 years of age. You are older than 68 years of age and your health care provider tells you that you are at risk for this type of infection. Your sexual activity has changed  since you were last screened, and you are at increased risk for chlamydia or gonorrhea. Ask your health care provider if you are at risk. Ask your health care provider about whether you are at high risk for HIV. Your health care provider may recommend a prescription medicine to help prevent HIV infection. If you choose to take medicine to prevent HIV, you should first get tested for HIV. You  should then be tested every 3 months for as long as you are taking the medicine. Pregnancy If you are about to stop having your period (premenopausal) and you may become pregnant, seek counseling before you get pregnant. Take 400 to 800 micrograms (mcg) of folic acid every day if you become pregnant. Ask for birth control (contraception) if you want to prevent pregnancy. Osteoporosis and menopause Osteoporosis is a disease in which the bones lose minerals and strength with aging. This can result in bone fractures. If you are 15 years old or older, or if you are at risk for osteoporosis and fractures, ask your health care provider if you should: Be screened for bone loss. Take a calcium  or vitamin D  supplement to lower your risk of fractures. Be given hormone replacement therapy (HRT) to treat symptoms of menopause. Follow these instructions at home: Alcohol use Do not drink alcohol if: Your health care provider tells you not to drink. You are pregnant, may be pregnant, or are planning to become pregnant. If you drink alcohol: Limit how much you have to: 0-1 drink a day. Know how much alcohol is in your drink. In the [place] one drink equals one 12 oz bottle of beer (355 mL), one 5 oz glass of wine (148 mL), or one 1 oz glass of hard liquor (44 mL). Lifestyle Do not use any products that contain nicotine or tobacco. These products include cigarettes, chewing tobacco, and vaping devices, such as e-cigarettes. If you need help quitting, ask your health care provider. Do not use street drugs. Do not share needles. Ask your health care provider for help if you need support or information about quitting drugs. General instructions Schedule regular health, dental, and eye exams. Stay current with your vaccines. Tell your health care provider if: You often feel depressed. You have ever been abused or do not feel safe at home. Summary Adopting a healthy lifestyle and getting preventive care are  important in promoting health and wellness. Follow your health care provider's instructions about healthy diet, exercising, and getting tested or screened for diseases. Follow your health care provider's instructions on monitoring your cholesterol and blood pressure. This information is not intended to replace advice given to you by your health care provider. Make sure you discuss any questions you have with your health care provider. Document Revised: 09/15/2020 Document Reviewed: 09/15/2020 Elsevier Patient Education  2024 [employer].

## 2024-01-25 ENCOUNTER — Ambulatory Visit (INDEPENDENT_AMBULATORY_CARE_PROVIDER_SITE_OTHER): Payer: Medicare Other | Admitting: Internal Medicine

## 2024-01-25 VITALS — BP 124/80 | HR 63 | Temp 98.0°F | Ht 65.0 in | Wt 157.0 lb

## 2024-01-25 DIAGNOSIS — F4323 Adjustment disorder with mixed anxiety and depressed mood: Secondary | ICD-10-CM

## 2024-01-25 DIAGNOSIS — K599 Functional intestinal disorder, unspecified: Secondary | ICD-10-CM

## 2024-01-25 DIAGNOSIS — E7800 Pure hypercholesterolemia, unspecified: Secondary | ICD-10-CM | POA: Diagnosis not present

## 2024-01-25 DIAGNOSIS — Z0000 Encounter for general adult medical examination without abnormal findings: Secondary | ICD-10-CM

## 2024-01-25 DIAGNOSIS — G479 Sleep disorder, unspecified: Secondary | ICD-10-CM | POA: Diagnosis not present

## 2024-01-25 DIAGNOSIS — K219 Gastro-esophageal reflux disease without esophagitis: Secondary | ICD-10-CM | POA: Diagnosis not present

## 2024-01-25 LAB — CBC
HCT: 37.6 % (ref 36.0–46.0)
Hemoglobin: 12.7 g/dL (ref 12.0–15.0)
MCHC: 33.7 g/dL (ref 30.0–36.0)
MCV: 88.4 fl (ref 78.0–100.0)
Platelets: 267 K/uL (ref 150.0–400.0)
RBC: 4.25 Mil/uL (ref 3.87–5.11)
RDW: 13 % (ref 11.5–15.5)
WBC: 4.5 K/uL (ref 4.0–10.5)

## 2024-01-25 LAB — COMPREHENSIVE METABOLIC PANEL WITH GFR
ALT: 22 U/L (ref 0–35)
AST: 19 U/L (ref 0–37)
Albumin: 4.4 g/dL (ref 3.5–5.2)
Alkaline Phosphatase: 67 U/L (ref 39–117)
BUN: 11 mg/dL (ref 6–23)
CO2: 29 meq/L (ref 19–32)
Calcium: 9.2 mg/dL (ref 8.4–10.5)
Chloride: 104 meq/L (ref 96–112)
Creatinine, Ser: 0.89 mg/dL (ref 0.40–1.20)
GFR: 66.72 mL/min (ref >60.00)
Glucose, Bld: 89 mg/dL (ref 70–99)
Potassium: 4.1 meq/L (ref 3.5–5.1)
Sodium: 140 meq/L (ref 135–145)
Total Bilirubin: 0.8 mg/dL (ref 0.2–1.2)
Total Protein: 7.1 g/dL (ref 6.0–8.3)

## 2024-01-25 LAB — LIPID PANEL
Cholesterol: 210 mg/dL — ABNORMAL HIGH (ref 0–200)
HDL: 45.2 mg/dL (ref >39.00)
LDL Cholesterol: 140 mg/dL — ABNORMAL HIGH (ref 0–99)
NonHDL: 164.87
Total CHOL/HDL Ratio: 5
Triglycerides: 123 mg/dL (ref 0.0–149.0)
VLDL: 24.6 mg/dL (ref 0.0–40.0)

## 2024-01-25 LAB — TSH: TSH: 1.7 u[IU]/mL (ref 0.35–5.50)

## 2024-01-25 MED ORDER — ALPRAZOLAM 0.25 MG PO TABS: 0.2500 mg | ORAL_TABLET | Freq: Two times a day (BID) | ORAL | 5 refills | Status: AC | PRN

## 2024-01-25 MED ORDER — ZALEPLON 5 MG PO CAPS: 5.0000 mg | ORAL_CAPSULE | Freq: Every evening | ORAL | 0 refills | Status: DC | PRN

## 2024-01-25 MED ORDER — HYOSCYAMINE SULFATE 0.125 MG SL SUBL: 0.1250 mg | SUBLINGUAL_TABLET | SUBLINGUAL | 5 refills | Status: AC | PRN

## 2024-01-25 MED ORDER — ONDANSETRON HCL 4 MG PO TABS: 4.0000 mg | ORAL_TABLET | Freq: Four times a day (QID) | ORAL | 1 refills | Status: AC | PRN

## 2024-01-25 NOTE — Assessment & Plan Note (Addendum)
 Chronic Not controlled - can go a couple of nights w/o sleeping at all Stop Ambien   - was taking 5 mg nightly as needed- dd try 10 mg and had hallucinations - has not taken it since Trail of sonata  5 mg night as needed Discussed sleep hygiene - advised a bedtime If 5 mg of Sonata  is not effective advised that she can try 5 mg Can try a different medication if that is not effective

## 2024-01-25 NOTE — Assessment & Plan Note (Addendum)
 Chronic Intolerant to statins-Crestor, atorvastatin , pravastatin -myalgias Coronary calcium  score is 0 so she is low risk Continue regular exercise, healthy diet Check lipids, CMP, TSH, CBC

## 2024-01-25 NOTE — Assessment & Plan Note (Addendum)
 Acute Has noticed over the last month varying bowel movements between constipation and diarrhea Does have constipation frequently Has had episodes of diarrhea related to certain things she eats which can be associated with nausea and abdominal cramping Typically has a bowel movement every day For the past month has been taking Imodium as needed for diarrhea and sometimes has to take something for constipation in the morning Start Benefiber 1 teaspoon daily-May need to increase to 2 teaspoons daily Start a probiotic You can use small dose if needed, but holding the above will prevent needing that-constipation could be still the problem Hyoscyamine  as needed

## 2024-01-25 NOTE — Assessment & Plan Note (Addendum)
 Chronic Well-controlled Continue bupropion  XL 150 mg daily, alprazolam  0.25 mg-has been taking half of a pill nightly and encouraged her to try to wean herself off this since that dose is likely not doing anything

## 2024-01-25 NOTE — Assessment & Plan Note (Signed)
 Chronic Controlled Continue omeprazole  20 mg daily prn Zofran  4 mg q 8 hr prn for nausea

## 2024-01-26 ENCOUNTER — Ambulatory Visit: Payer: Self-pay | Admitting: Internal Medicine

## 2024-01-26 DIAGNOSIS — E7800 Pure hypercholesterolemia, unspecified: Secondary | ICD-10-CM

## 2024-01-27 ENCOUNTER — Other Ambulatory Visit (HOSPITAL_COMMUNITY): Payer: Self-pay

## 2024-01-27 ENCOUNTER — Telehealth: Payer: Self-pay

## 2024-01-27 MED ORDER — EZETIMIBE 10 MG PO TABS: 10.0000 mg | ORAL_TABLET | Freq: Every day | ORAL | 3 refills | Status: AC

## 2024-01-27 NOTE — Telephone Encounter (Signed)
 Clinical questions answered and PA submitted.

## 2024-01-27 NOTE — Telephone Encounter (Signed)
 Pharmacy Patient Advocate Encounter   Received notification from Pt Calls Messages that prior authorization for Zalelplon 5mg  caps is required/requested.   Insurance verification completed.   The patient is insured through [place] [employer] .   Per test claim: PA required; PA started via CoverMyMeds. KEY [id number] . Waiting for clinical questions to populate.

## 2024-01-30 ENCOUNTER — Telehealth: Payer: Self-pay

## 2024-01-30 ENCOUNTER — Encounter: Payer: Self-pay | Admitting: Internal Medicine

## 2024-01-30 NOTE — Telephone Encounter (Signed)
 Pharmacy Patient Advocate Encounter  Received notification from [facility] [place] [employer] that Prior Authorization for Zaleplon  5mg  caps has been DENIED.  See denial reason below. No denial letter attached in CMM. Will attach denial letter to Media tab once received.   PA #/Case ID/Reference #: [id number]

## 2024-01-30 NOTE — Telephone Encounter (Signed)
 Copied from CRM #[id number]. Topic: Clinical - Medical Advice >> Jan 30, 2024  9:45 AM Tiffini S wrote: Reason for CRM: Christal with [employer]  [phone] option 5 call about medication being denied for  60 tablets for 30 days Zaleplon - will fax the request and pcp can appeal the decision. Will also mail a letter out to the office.

## 2024-01-30 NOTE — Telephone Encounter (Signed)
 Addressed.

## 2024-01-30 NOTE — Telephone Encounter (Signed)
 Insurance only covers 1 pill every 30 days.  Message sent to Dr. Geofm via my-chart where patient sent message for her to send or update change.

## 2024-01-31 MED ORDER — ZALEPLON 5 MG PO CAPS: 5.0000 mg | ORAL_CAPSULE | Freq: Every evening | ORAL | 0 refills | Status: DC | PRN

## 2024-02-14 ENCOUNTER — Ambulatory Visit: Admitting: Family Medicine

## 2024-02-14 ENCOUNTER — Ambulatory Visit: Attending: Family Medicine

## 2024-02-14 ENCOUNTER — Encounter: Payer: Self-pay | Admitting: Internal Medicine

## 2024-02-14 ENCOUNTER — Encounter: Payer: Self-pay | Admitting: Family Medicine

## 2024-02-14 VITALS — BP 130/82 | HR 76 | Temp 98.8°F | Ht 65.0 in | Wt 156.4 lb

## 2024-02-14 DIAGNOSIS — R002 Palpitations: Secondary | ICD-10-CM | POA: Diagnosis not present

## 2024-02-14 DIAGNOSIS — R600 Localized edema: Secondary | ICD-10-CM

## 2024-02-14 DIAGNOSIS — R000 Tachycardia, unspecified: Secondary | ICD-10-CM

## 2024-02-14 DIAGNOSIS — I872 Venous insufficiency (chronic) (peripheral): Secondary | ICD-10-CM | POA: Insufficient documentation

## 2024-02-14 LAB — CBC WITH DIFFERENTIAL/PLATELET
Basophils Absolute: 0 K/uL (ref 0.0–0.1)
Basophils Relative: 0.4 % (ref 0.0–3.0)
Eosinophils Absolute: 0 K/uL (ref 0.0–0.7)
Eosinophils Relative: 0.5 % (ref 0.0–5.0)
HCT: 38.7 % (ref 36.0–46.0)
Hemoglobin: 12.9 g/dL (ref 12.0–15.0)
Lymphocytes Relative: 27.6 % (ref 12.0–46.0)
Lymphs Abs: 1.5 K/uL (ref 0.7–4.0)
MCHC: 33.3 g/dL (ref 30.0–36.0)
MCV: 88.7 fl (ref 78.0–100.0)
Monocytes Absolute: 0.3 K/uL (ref 0.1–1.0)
Monocytes Relative: 5.3 % (ref 3.0–12.0)
Neutro Abs: 3.6 K/uL (ref 1.4–7.7)
Neutrophils Relative %: 66.2 % (ref 43.0–77.0)
Platelets: 281 K/uL (ref 150.0–400.0)
RBC: 4.36 Mil/uL (ref 3.87–5.11)
RDW: 13.1 % (ref 11.5–15.5)
WBC: 5.4 K/uL (ref 4.0–10.5)

## 2024-02-14 LAB — COMPREHENSIVE METABOLIC PANEL WITH GFR
ALT: 28 U/L (ref 0–35)
AST: 20 U/L (ref 0–37)
Albumin: 4.5 g/dL (ref 3.5–5.2)
Alkaline Phosphatase: 71 U/L (ref 39–117)
BUN: 10 mg/dL (ref 6–23)
CO2: 29 meq/L (ref 19–32)
Calcium: 9.5 mg/dL (ref 8.4–10.5)
Chloride: 103 meq/L (ref 96–112)
Creatinine, Ser: 0.78 mg/dL (ref 0.40–1.20)
GFR: 78.14 mL/min (ref >60.00)
Glucose, Bld: 90 mg/dL (ref 70–99)
Potassium: 4.1 meq/L (ref 3.5–5.1)
Sodium: 139 meq/L (ref 135–145)
Total Bilirubin: 0.6 mg/dL (ref 0.2–1.2)
Total Protein: 7.3 g/dL (ref 6.0–8.3)

## 2024-02-14 LAB — BRAIN NATRIURETIC PEPTIDE: Pro B Natriuretic peptide (BNP): 46 pg/mL (ref 0.0–100.0)

## 2024-02-14 LAB — TSH: TSH: 2.65 u[IU]/mL (ref 0.35–5.50)

## 2024-02-14 NOTE — Progress Notes (Signed)
 Acute Office Visit  Subjective:     Patient ID: Ashley Norton, female    DOB: 18-Mar-1956, 68 y.o.   MRN: [id number]  Chief Complaint  Patient presents with   Joint Swelling    L ankle swelling for about 1 week   Tachycardia    HPI  Discussed the use of AI scribe software for clinical note transcription with the patient, who gave verbal consent to proceed.  History of Present Illness Ashley Norton is a 68 year old female who presents with fluctuating heart rate, swollen ankles, and shortness of breath.  Cardiac rhythm disturbance - Heart rate fluctuates between the 40s and 156 bpm, as recorded by Apple watch - Episodes occur randomly, including while sitting at night when heart rate is in the 70s or 80s - Family history of atrial fibrillation in mother and cardiac arrhythmia requiring ablation in brother  Peripheral edema and lower extremity pain - Swelling in ankles, more pronounced on the left, onset over the weekend - Ankle swelling partially relieved by elevation, ice, and compression socks - Walking causes pain but is continued to aid fluid movement - History of broken foot last year - History of bulging vein in leg, treated with vein stripping in her 30s  Dyspnea - Shortness of breath present  Dyslipidemia and medication intolerance - Currently on non-statin cholesterol medication due to statin intolerance (severe leg pain with statins) - Swollen ankles listed as a side effect of current cholesterol medication - Recent cholesterol test showed high LDL levels - Follow-up blood work scheduled in six weeks - No rashes or other symptoms since starting new medication     ROS Per HPI      Objective:    BP 130/82 (BP Location: Left Arm, Patient Position: Sitting)   Pulse 76   Temp 98.8 F (37.1 C) (Temporal)   Ht 5' 5 (1.651 m)   Wt 156 lb 6.4 oz (70.9 kg)   SpO2 95%   BMI 26.03 kg/m    Physical Exam Vitals and nursing note reviewed.   Constitutional:      General: She is not in acute distress.    Appearance: Normal appearance. She is normal weight.  HENT:     Head: Normocephalic and atraumatic.     Right Ear: External ear normal.     Left Ear: External ear normal.     Nose: Nose normal.     Mouth/Throat:     Mouth: Mucous membranes are moist.     Pharynx: Oropharynx is clear.  Eyes:     Extraocular Movements: Extraocular movements intact.     Pupils: Pupils are equal, round, and reactive to light.  Cardiovascular:     Rate and Rhythm: Normal rate and regular rhythm.     Pulses: Normal pulses.     Heart sounds: Normal heart sounds.  Pulmonary:     Effort: Pulmonary effort is normal. No respiratory distress.     Breath sounds: Normal breath sounds. No wheezing, rhonchi or rales.  Musculoskeletal:        General: Normal range of motion.     Cervical back: Normal range of motion.     Right lower leg: No edema.     Left lower leg: No edema.  Lymphadenopathy:     Cervical: No cervical adenopathy.  Neurological:     General: No focal deficit present.     Mental Status: She is alert and oriented to person, place, and time.  Psychiatric:  Mood and Affect: Mood normal.        Thought Content: Thought content normal.   EKG: Reviewed and interpreted by me Indication: irregular rhythm Rate: 74 Interpretation: NSR with nonspecific T wave changes Changes from previous: none significant   Results for orders placed or performed in visit on 02/14/24  CBC with Differential/Platelet  Result Value Ref Range   WBC 5.4 4.0 - 10.5 K/uL   RBC 4.36 3.87 - 5.11 Mil/uL   Hemoglobin 12.9 12.0 - 15.0 g/dL   HCT 61.2 63.9 - 53.9 %   MCV 88.7 78.0 - 100.0 fl   MCHC 33.3 30.0 - 36.0 g/dL   RDW 86.8 88.4 - 84.4 %   Platelets 281.0 150.0 - 400.0 K/uL   Neutrophils Relative % 66.2 43.0 - 77.0 %   Lymphocytes Relative 27.6 12.0 - 46.0 %   Monocytes Relative 5.3 3.0 - 12.0 %   Eosinophils Relative 0.5 0.0 - 5.0 %    Basophils Relative 0.4 0.0 - 3.0 %   Neutro Abs 3.6 1.4 - 7.7 K/uL   Lymphs Abs 1.5 0.7 - 4.0 K/uL   Monocytes Absolute 0.3 0.1 - 1.0 K/uL   Eosinophils Absolute 0.0 0.0 - 0.7 K/uL   Basophils Absolute 0.0 0.0 - 0.1 K/uL  Comprehensive metabolic panel with GFR  Result Value Ref Range   Sodium 139 135 - 145 mEq/L   Potassium 4.1 3.5 - 5.1 mEq/L   Chloride 103 96 - 112 mEq/L   CO2 29 19 - 32 mEq/L   Glucose, Bld 90 70 - 99 mg/dL   BUN 10 6 - 23 mg/dL   Creatinine, Ser 9.21 0.40 - 1.20 mg/dL   Total Bilirubin 0.6 0.2 - 1.2 mg/dL   Alkaline Phosphatase 71 39 - 117 U/L   AST 20 0 - 37 U/L   ALT 28 0 - 35 U/L   Total Protein 7.3 6.0 - 8.3 g/dL   Albumin 4.5 3.5 - 5.2 g/dL   GFR 21.85 >39.99 mL/min   Calcium  9.5 8.4 - 10.5 mg/dL  TSH  Result Value Ref Range   TSH 2.65 0.35 - 5.50 uIU/mL  B Nat Peptide  Result Value Ref Range   Pro B Natriuretic peptide (BNP) 46.0 0.0 - 100.0 pg/mL        Assessment & Plan:   Assessment and Plan Assessment & Plan Palpitations and tachycardia Intermittent palpitations and tachycardia with heart rate fluctuations from 40s to 150s. EKG shows no acute abnormalities. Differential includes arrhythmia, possibly related to family history of AFib. - Order heart monitor for home use for one week. - Refer to cardiology for further evaluation. - Check electrolytes and BNP. - Advise to stay hydrated and wear compression socks.  Localized lower extremity edema Recent right ankle swelling, possibly related to new cholesterol medication. Differential includes medication side effect or vascular insufficiency. - Check electrolytes and BNP. - Advise to wear compression socks and stay hydrated. - Consider referral to vascular specialist if heart evaluation is normal.     Orders Placed This Encounter  Procedures   CBC with Differential/Platelet    Release to patient:   Immediate [1]   Comprehensive metabolic panel with GFR    Release to patient:    Immediate [1]   TSH   B Nat Peptide   Ambulatory referral to Cardiology    Referral Priority:   Routine    Referral Type:   Consultation    Referral Reason:   Specialty Services Required  Referred to Provider:   Lonni Slain, MD    Number of Visits Requested:   1   LONG TERM MONITOR (3-14 DAYS)    Standing Status:   Future    Number of Occurrences:   1    Expiration Date:   02/13/2025    Where should this test be performed?:   [facility]    Does the patient have an implanted cardiac device?:   No    Prescribed days of wear:   7    Type of enrollment:   Home Enrollment    Vendor::   [employer]    Reason for Exam:   Tachycardia R00.0   EKG 12-Lead   EKG     No orders of the defined types were placed in this encounter.   Return if symptoms worsen or fail to improve.  Corean LITTIE Ku, FNP

## 2024-02-14 NOTE — Progress Notes (Unsigned)
 EP to read.

## 2024-02-14 NOTE — Patient Instructions (Addendum)
 We are checking labs today, will be in contact with any results that require further attention  EKG looks ok today.  I have sent a heart monitor for you to wear for a week to evaluate your heart rate and rhythm. Follow the directions on the package and send it back per instructions.   Referral to cardiology. Someone will be reaching out to get you scheduled.   Follow-up with me for new or worsening symptoms.

## 2024-02-16 ENCOUNTER — Ambulatory Visit: Payer: Self-pay | Admitting: Family Medicine

## 2024-02-16 NOTE — Progress Notes (Signed)
 CBC is normal, no infection or anemia today.  Metabolic shows blood sugar, sodium, potassium, kidney function and liver function are all normal.   BNP was normal, not concerned about heart failure.  Thyroid  function is normal.

## 2024-02-28 DIAGNOSIS — Z01419 Encounter for gynecological examination (general) (routine) without abnormal findings: Secondary | ICD-10-CM | POA: Diagnosis not present

## 2024-03-05 DIAGNOSIS — R000 Tachycardia, unspecified: Secondary | ICD-10-CM

## 2024-03-05 DIAGNOSIS — R600 Localized edema: Secondary | ICD-10-CM | POA: Diagnosis not present

## 2024-03-05 DIAGNOSIS — R002 Palpitations: Secondary | ICD-10-CM

## 2024-03-06 DIAGNOSIS — F4381 Prolonged grief disorder: Secondary | ICD-10-CM | POA: Diagnosis not present

## 2024-03-06 DIAGNOSIS — F331 Major depressive disorder, recurrent, moderate: Secondary | ICD-10-CM | POA: Diagnosis not present

## 2024-03-07 ENCOUNTER — Encounter: Payer: Self-pay | Admitting: Family Medicine

## 2024-03-08 NOTE — Progress Notes (Signed)
 Addressed in TE.

## 2024-03-10 ENCOUNTER — Other Ambulatory Visit: Payer: Self-pay | Admitting: Internal Medicine

## 2024-03-12 NOTE — Telephone Encounter (Signed)
 Patient scheduled for Cardiology on 11/10

## 2024-03-14 DIAGNOSIS — F331 Major depressive disorder, recurrent, moderate: Secondary | ICD-10-CM | POA: Diagnosis not present

## 2024-03-14 DIAGNOSIS — F4381 Prolonged grief disorder: Secondary | ICD-10-CM | POA: Diagnosis not present

## 2024-03-16 ENCOUNTER — Encounter (HOSPITAL_BASED_OUTPATIENT_CLINIC_OR_DEPARTMENT_OTHER): Payer: Self-pay

## 2024-03-19 ENCOUNTER — Encounter (HOSPITAL_BASED_OUTPATIENT_CLINIC_OR_DEPARTMENT_OTHER): Payer: Self-pay | Admitting: Family

## 2024-03-19 ENCOUNTER — Ambulatory Visit (HOSPITAL_BASED_OUTPATIENT_CLINIC_OR_DEPARTMENT_OTHER): Admitting: Family

## 2024-03-19 VITALS — BP 128/78 | HR 74 | Ht 66.0 in | Wt 159.2 lb

## 2024-03-19 DIAGNOSIS — R002 Palpitations: Secondary | ICD-10-CM

## 2024-03-19 DIAGNOSIS — I8311 Varicose veins of right lower extremity with inflammation: Secondary | ICD-10-CM | POA: Diagnosis not present

## 2024-03-19 DIAGNOSIS — R600 Localized edema: Secondary | ICD-10-CM

## 2024-03-19 DIAGNOSIS — I8312 Varicose veins of left lower extremity with inflammation: Secondary | ICD-10-CM

## 2024-03-19 DIAGNOSIS — I471 Supraventricular tachycardia, unspecified: Secondary | ICD-10-CM | POA: Diagnosis not present

## 2024-03-19 MED ORDER — METOPROLOL TARTRATE 25 MG PO TABS: ORAL_TABLET | ORAL | 1 refills | Status: AC

## 2024-03-19 NOTE — Patient Instructions (Addendum)
 Medication Instructions:  START Metoprolol tartrate 25mg  as needed for palpitations  *If you need a refill on your cardiac medications before your next appointment, please call your pharmacy*  Testing/Procedures: Your heart monitor was reassuring! It showed very brief episodes of a fast heart beat called SVT (supraventricular tachycardia). This is not dangerous but can make it feel like your heart is racing.   Follow-Up: At [facility], you and your health needs are our priority.  As part of our continuing mission to provide you with exceptional heart care, our providers are all part of one team.  This team includes your primary Cardiologist (physician) and Advanced Practice Providers or APPs (Physician Assistants and Nurse Practitioners) who all work together to provide you with the care you need, when you need it.  Your next appointment:   2-3 month(s)  Provider:   Rosaline Bane, NP or Reche Finder, NP    We recommend signing up for the patient portal called MyChart.  Sign up information is provided on this After Visit Summary.  MyChart is used to connect with patients for Virtual Visits (Telemedicine).  Patients are able to view lab/test results, encounter notes, upcoming appointments, etc.  Non-urgent messages can be sent to your provider as well.   To learn more about what you can do with MyChart, go to [email or web address].   Other Instructions   To prevent palpitations: Make sure you are adequately hydrated.  Avoid and/or limit caffeine containing beverages like soda or tea. Exercise regularly.  Manage stress well. Some over the counter medications can cause palpitations such as Benadryl, AdvilPM, TylenolPM. Regular Advil  or Tylenol  do not cause palpitations.    To prevent or reduce lower extremity swelling: Eat a low salt diet. Salt makes the body hold onto extra fluid which causes swelling. Sit with legs elevated. For example, in the recliner or on an  ottoman.  Wear knee-high compression stockings during the daytime. Ones labeled 15-20 mmHg provide good compression.

## 2024-03-19 NOTE — Progress Notes (Unsigned)
 Cardiology Office Note   Date:  03/20/2024  ID:  LINCOLN KLEINER, DOB [birth year], MRN [id number] PCP: Geofm Glade PARAS, MD  [facility] Providers Cardiologist:  None Cardiology APP:  Vannie Reche RAMAN, NP     History of Present Illness Ashley Norton is a 68 y.o. 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   Prior CAC 01/05/21 of 0. Evaluated 06/10/22 for chest pain which was atypical, recommended to follow up PRN.   Monitor 02/2024 predominantly NSR average heart rate 71 bpm. 8 episodes of nunsustained SVT longest 5 beats. Triggered events associated with NSR.   Presents today for follow up with  friend. Reports she had episodes of her heart racing folled by fatigue. She monitored heart rate on smart watch and heart rate would go down to 40s and then be as high as 150s. Most notable episode when she was watching a movie heart rate 40 bpm then 150 then 40 bpm with no clear trigger. Monday following the beach trip her ankles were significantly swollen. Notes daily bilateral ankle swelling which has improved some with compression stockings. Does note the compression stockings are uncomfortable. She is elevating her legs at night. Notes in her 30s she had a bulging vein in her left leg which was stripped. She does have some broken blood vessles around her left ankle line.   Notes prior muscle aches with statins. Does tolerate Zetia .   Reports difficulty sleeping. Trouble falling asleep. She is taking magnesium l-theanine. She is on sonata  which did not help. Encouraged to discuss with PCP. She does not have symptoms suggestive of sleep apnea.   ROS: Please see the history of present illness.    All other systems reviewed and are negative.   Studies Reviewed      Cardiac Studies & Procedures    ______________________________________________________________________________________________        SHERRILEE  LONG TERM MONITOR (3-14 DAYS) 03/05/2024  Narrative Patch Wear Time:  9 days and 0 hours  HR 48 - 150, average 71 bpm. 8 nonsustained SVT (longest 5 beats) No atrial fibrillation detected. Rare supraventricular ectopy. Rare ventricular ectopy. No sustained arrhythmias. Symptom trigger episodes correspond to sinus rhythm   [facility] Cardiac Electrophysiology       ______________________________________________________________________________________________      Risk Assessment/Calculations           Physical Exam VS:  BP 128/78 (BP Location: Left Arm, Patient Position: Sitting, Cuff Size: Normal)   Pulse 74   Ht 5' 6 (1.676 m)   Wt 159 lb 3.2 oz (72.2 kg)   SpO2 97%   BMI 25.70 kg/m        Wt Readings from Last 3 Encounters:  03/19/24 159 lb 3.2 oz (72.2 kg)  02/14/24 156 lb 6.4 oz (70.9 kg)  01/25/24 157 lb (71.2 kg)    GEN: Well nourished, well developed in no acute distress NECK: No JVD; No carotid bruits CARDIAC: RRR, no murmurs, rubs, gallops RESPIRATORY:  Clear to auscultation without rales, wheezing or rhonchi  ABDOMEN: Soft, non-tender, non-distended EXTREMITIES:  Non pitting pretibial edema; No deformity   ASSESSMENT AND PLAN  Palpitations / SVT - monitor with 8 brief episdoes of SVT. Reassurance provided. Rx metoprolol tartrate 25mg  PRN for palpitations. Encouraged to manage stress well, stay well hydrated, limit caffeine/alcohol.   LE edema / Varicose veins - consistent with venous  insufficiency as worse by end of day, improved with compression stockings. Anticipate her prior work standing for many years contributory. Low suspicion for heart failure, no indication for echocardiogram at this time. Leg elevation, compression stockings, low sodium diet recommended. Referred to [facility] for further management.        Dispo:  follow up in 2-3 months   Signed, Reche GORMAN Finder, NP

## 2024-03-20 ENCOUNTER — Telehealth: Payer: Self-pay

## 2024-03-20 ENCOUNTER — Telehealth: Payer: Self-pay | Admitting: Family

## 2024-03-20 ENCOUNTER — Encounter (HOSPITAL_BASED_OUTPATIENT_CLINIC_OR_DEPARTMENT_OTHER): Payer: Self-pay | Admitting: Family

## 2024-03-20 MED ORDER — FUROSEMIDE 20 MG PO TABS: ORAL_TABLET | ORAL | 0 refills | Status: DC

## 2024-03-20 NOTE — Telephone Encounter (Signed)
 Called left a voicemail. Asked to call back.

## 2024-03-20 NOTE — Telephone Encounter (Signed)
 Pt was referred to [facility]in [facility] V[facility] and was not scheduled until March and wanted a c/b to see if she could wait that long to be seen. Please advise

## 2024-03-20 NOTE — Telephone Encounter (Signed)
 Returned a call back to the pt.  She is worried about her lower extremity edema and not being able to see [facility] until March 2026, for venous insufficiency.   Spoke with Reche Finder, NP about this and she advised to inform the pt that her lower extremity swelling is not coming from her heart and is coming from Venous Insufficiency.  Pt education provided about this.   Reche Finder, NP did give order to call in 2 tablets of lasix 20 mg and pt can take 1 tablet by mouth PRN for LEE.  Pt made aware of this and sent this script into [facility] for her.   Advised her to wear compressions during the day and elevate lower extremities at rest.    Pt states [facility] did add her to a cancellation list.   Pt verbalized understanding and agrees with this plan.

## 2024-03-21 ENCOUNTER — Encounter: Payer: Self-pay | Admitting: Internal Medicine

## 2024-03-21 ENCOUNTER — Other Ambulatory Visit (INDEPENDENT_AMBULATORY_CARE_PROVIDER_SITE_OTHER)

## 2024-03-21 ENCOUNTER — Ambulatory Visit: Payer: Self-pay | Admitting: Internal Medicine

## 2024-03-21 DIAGNOSIS — E7800 Pure hypercholesterolemia, unspecified: Secondary | ICD-10-CM

## 2024-03-21 DIAGNOSIS — F4381 Prolonged grief disorder: Secondary | ICD-10-CM | POA: Diagnosis not present

## 2024-03-21 DIAGNOSIS — G479 Sleep disorder, unspecified: Secondary | ICD-10-CM

## 2024-03-21 DIAGNOSIS — F331 Major depressive disorder, recurrent, moderate: Secondary | ICD-10-CM | POA: Diagnosis not present

## 2024-03-21 LAB — LIPID PANEL
Cholesterol: 163 mg/dL (ref 0–200)
HDL: 45.2 mg/dL (ref >39.00)
LDL Cholesterol: 98 mg/dL (ref 0–99)
NonHDL: 118.21
Total CHOL/HDL Ratio: 4
Triglycerides: 102 mg/dL (ref 0.0–149.0)
VLDL: 20.4 mg/dL (ref 0.0–40.0)

## 2024-03-21 LAB — HEPATIC FUNCTION PANEL
ALT: 31 U/L (ref 0–35)
AST: 24 U/L (ref 0–37)
Albumin: 4.2 g/dL (ref 3.5–5.2)
Alkaline Phosphatase: 70 U/L (ref 39–117)
Bilirubin, Direct: 0.1 mg/dL (ref 0.0–0.3)
Total Bilirubin: 0.6 mg/dL (ref 0.2–1.2)
Total Protein: 6.9 g/dL (ref 6.0–8.3)

## 2024-03-22 MED ORDER — TRAZODONE HCL 50 MG PO TABS: 50.0000 mg | ORAL_TABLET | Freq: Every day | ORAL | 5 refills | Status: AC

## 2024-04-02 ENCOUNTER — Telehealth: Payer: Self-pay

## 2024-04-02 DIAGNOSIS — F331 Major depressive disorder, recurrent, moderate: Secondary | ICD-10-CM | POA: Diagnosis not present

## 2024-04-02 DIAGNOSIS — F4381 Prolonged grief disorder: Secondary | ICD-10-CM | POA: Diagnosis not present

## 2024-04-02 NOTE — Telephone Encounter (Signed)
 Called to discuss PREP program referral; left voicemail requesting return call.

## 2024-05-11 ENCOUNTER — Other Ambulatory Visit: Payer: Self-pay | Admitting: Internal Medicine

## 2024-05-11 DIAGNOSIS — Z1231 Encounter for screening mammogram for malignant neoplasm of breast: Secondary | ICD-10-CM

## 2024-05-17 ENCOUNTER — Encounter: Payer: Self-pay | Admitting: Internal Medicine

## 2024-05-17 NOTE — Assessment & Plan Note (Addendum)
 Chronic Bilateral lower extremity swelling History of left lower extremity vein stripping years ago, prominent veins around ankles  L > R Wearing compression socks, elevating legs at night, low-sodium diet, walking Edema likely secondary to venous insufficiency Has appointment to see vascular surgery in March Did try the Lasix  and it did help Has a trip coming up to [place] and [place] and is anxious about the long plane ride and travel and general were the potential for worsening leg swelling Currently wearing 15-20 mmHg compression socks-try 20-30 mmHg compression socks Continue elevating, low-sodium diet and regular walking Discussed this is a chronic issue and could get worse-hopefully vascular will be able to help Start furosemide  20 mg daily as needed-may need to take daily, but I really try to take as little as possible-discussed increased risk of dehydration/decreased kidney function, low BP/lightheadedness Continue good water intake

## 2024-05-17 NOTE — Progress Notes (Signed)
 "   Subjective:    Patient ID: Ashley Norton, female    DOB: [birth year], 69 y.o.   MRN: [id number]      HPI Ashley Norton is here for  Chief Complaint  Patient presents with   Joint Swelling    Continued Problems with ankle swelling and medication for a trip out of the country.    Trip coming up to [place] and [place].    Bilateral ankle swelling.  Wearing compression socks ( 15-20 mmHG).  She elevates her legs at night.  Discussed with cardiology 03/2024-lower extremities.  Edema likely related to venous insufficiency.  History of left lower extremity vein stripping.  She has prominent veins around ankles.  She has an appointment to see vascular surgery in March 2026.  Has taken furosemide  and it did help.  Swelling is constant.     Medications and allergies reviewed with patient and updated if appropriate.  Medications Ordered Prior to Encounter[1]  Review of Systems  Respiratory:  Negative for shortness of breath.   Cardiovascular:  Positive for leg swelling. Negative for chest pain and palpitations.       Objective:   Vitals:   05/18/24 0944  BP: 110/70  Pulse: 66  Temp: 98.2 F (36.8 C)  SpO2: 98%   BP Readings from Last 3 Encounters:  05/18/24 110/70  03/19/24 128/78  02/14/24 130/82   Wt Readings from Last 3 Encounters:  05/18/24 145 lb (65.8 kg)  03/19/24 159 lb 3.2 oz (72.2 kg)  02/14/24 156 lb 6.4 oz (70.9 kg)   Body mass index is 23.4 kg/m.    Physical Exam Constitutional:      General: She is not in acute distress.    Appearance: Normal appearance. She is not ill-appearing.  HENT:     Head: Normocephalic and atraumatic.  Cardiovascular:     Comments: Varicose veins b/l LE LLE> RLE, several small capillaries L ankle > R Ankle Musculoskeletal:     Right lower leg: No edema.     Left lower leg: Edema (trace) present.  Skin:    General: Skin is warm and dry.  Neurological:     Mental Status: She is alert.             Assessment & Plan:    See Problem List for Assessment and Plan of chronic medical problems.          [1]  Current Outpatient Medications on File Prior to Visit  Medication Sig Dispense Refill   ALPRAZolam  (XANAX ) 0.25 MG tablet Take 1 tablet (0.25 mg total) by mouth 2 (two) times daily as needed. 30 tablet 5   ascorbic acid (VITAMIN C) 1000 MG tablet Vitamin C 1,000 mg tablet     buPROPion  (WELLBUTRIN  XL) 150 MG 24 hr tablet TAKE 1 TABLET BY MOUTH AT BEDTIME 90 tablet 3   cholecalciferol (VITAMIN D3) 25 MCG (1000 UNIT) tablet Take 1,000 Units by mouth daily. 2 tablets a day     ezetimibe  (ZETIA ) 10 MG tablet Take 1 tablet (10 mg total) by mouth daily. 90 tablet 3   fluticasone  (FLONASE ) 50 MCG/ACT nasal spray Place 1 spray into both nostrils daily. 15.8 mL 0   furosemide  (LASIX ) 20 MG tablet Take 1 tablet (20 mg total) by mouth as needed for lower extremity swelling. 2 tablet 0   HYDROcodone -acetaminophen  (NORCO/VICODIN) 5-325 MG tablet Take 1 tablet by mouth every 4 (four) hours as needed (pain).     hyoscyamine  (LEVSIN  SL) 0.125 MG  SL tablet Place 1 tablet (0.125 mg total) under the tongue every 4 (four) hours as needed. 30 tablet 5   L-Theanine 200 MG CAPS Take 1 tablet by mouth at bedtime.     Loratadine (CLARITIN PO) Take 10 mg by mouth daily as needed (allergies).     MAGNESIUM COMPLEX PO Take 1 capsule by mouth daily.     methocarbamol  (ROBAXIN ) 500 MG tablet Take 1 tablet (500 mg total) by mouth 2 (two) times daily as needed for muscle spasms. 20 tablet 0   metoprolol  tartrate (LOPRESSOR ) 25 MG tablet As needed for palpitations, heart racing. May take up to twice per day. 30 tablet 1   Multiple Vitamins-Minerals (MULTI FOR HER PO) Take by mouth.     ondansetron  (ZOFRAN ) 4 MG tablet Take 1 tablet (4 mg total) by mouth every 6 (six) hours as needed for nausea or vomiting. 30 tablet 1   RETIN-A 0.05 % cream Apply topically at bedtime.     traZODone  (DESYREL ) 50 MG  tablet Take 1 tablet (50 mg total) by mouth at bedtime. 30 tablet 5   Zinc 30 MG TABS Take by mouth.     [DISCONTINUED] gabapentin  (NEURONTIN ) 300 MG capsule Take 1 capsule (300 mg total) by mouth at bedtime. (Patient not taking: Reported on 05/13/2015) 90 capsule 4   No current facility-administered medications on file prior to visit.   "

## 2024-05-18 ENCOUNTER — Ambulatory Visit: Admitting: Internal Medicine

## 2024-05-18 VITALS — BP 110/70 | HR 66 | Temp 98.2°F | Ht 66.0 in | Wt 154.0 lb

## 2024-05-18 DIAGNOSIS — I872 Venous insufficiency (chronic) (peripheral): Secondary | ICD-10-CM | POA: Diagnosis not present

## 2024-05-18 DIAGNOSIS — R600 Localized edema: Secondary | ICD-10-CM

## 2024-05-18 MED ORDER — FUROSEMIDE 20 MG PO TABS: 20.0000 mg | ORAL_TABLET | Freq: Every day | ORAL | 0 refills | Status: AC | PRN

## 2024-05-18 MED ORDER — SCOPOLAMINE 1 MG/3DAYS TD PT72: 1.0000 | MEDICATED_PATCH | TRANSDERMAL | 0 refills | Status: AC

## 2024-05-18 NOTE — Patient Instructions (Signed)
 "   Medications changes include :   furosemide  20 mg daily as needed.  Scopolamine  patch    Try the 20-30 mm Hg compression socks    Chronic Venous Insufficiency Chronic venous insufficiency is a condition that causes the veins in the legs to struggle to pump blood from the legs to the heart. It is also called venous stasis. This condition can happen when the vein walls are stretched, weakened, or damaged. It can also happen when the valves inside the vein are damaged. With the right treatment, you should be able to still lead an active life. What are the causes? Common causes of this condition include: Venous hypertension. This is high blood pressure inside the veins. Sitting or standing too long. This can cause increased blood pressure in the veins of the leg. Deep vein thrombosis (DVT). This is a blood clot that blocks blood flow in a vein. Phlebitis. This is inflammation of a vein. It can cause a blood clot to form. An abnormal growth of cells (tumor) in the area between your hip bones (pelvis). This can cause blood to back up. What increases the risk? Factors that may make you more likely to get this condition include: Having a family history of the condition. Being overweight. Being pregnant. Not getting enough exercise. Smoking. Having a job that requires you to sit or stand in one place for a long time. Being a certain age. Females in their 72s and 36s and males in their 58s are more likely to get this condition. What are the signs or symptoms? Symptoms of this condition include: Varicose veins. These are veins that are enlarged, bulging, or twisted. Skin breakdown or ulcers. Reddened skin or dark discoloration of the skin on the leg between the knee and ankle. Lipodermatosclerosis. This is brown, smooth, tight, and painful skin just above the ankle. It is often on the inside of the leg. Swelling of the legs. How is this diagnosed? This condition may be diagnosed based on  your medical history and a physical exam. You may also need tests, such as: A duplex ultrasound. This shows how blood flows through a blood vessel. Plethysmography. This tests blood flow. Venogram. This looks at the veins using an X-ray and dye. How is this treated? The goals of treatment are to help you return to an active life and to relieve pain. Treatment may include: Wearing compression stockings. These do not cure the condition but can help relieve symptoms. They can also help stop your condition from getting worse. Sclerotherapy. This involves injecting a solution to shrink damaged veins. Surgery. This may include: Vein stripping. This is when a diseased vein is taken out. Laser ablation surgery. This is when blood flow is cut off through the vein. Repairing or remaking a valve inside the affected vein. Follow these instructions at home: Lifestyle Do not use any products that contain nicotine or tobacco. These products include cigarettes, chewing tobacco, and vaping devices, such as e-cigarettes. If you need help quitting, ask your health care provider. Stay active. Exercise, walk, or do other activities. Ask your provider what activities are safe for you. General instructions Take over-the-counter and prescription medicines only as told by your provider. Drink enough fluid to keep your pee (urine) pale yellow. Wear compression stockings as told by your provider. These stockings help to prevent blood clots and reduce swelling in your legs. Keep all follow-up visits. Your provider will check your legs for any changes and adjust your treatment plan as needed.  Contact a health care provider if: You have redness, swelling, or more pain in the affected area. You see a red streak or line that goes up or down from the area. You have skin breakdown or skin loss. You get an injury in the affected area. You get a fever. Get help right away if: You have severe pain that does not get better  with medicine. You get an injury and an open wound in the affected area. Your foot or ankle becomes numb or weak all of a sudden. You have trouble moving your foot or ankle. Your symptoms do not go away or get worse. You have chest pain. You have shortness of breath. These symptoms may be an emergency. Get help right away. Call 911. Do not wait to see if the symptoms will go away. Do not drive yourself to the hospital. This information is not intended to replace advice given to you by your health care provider. Make sure you discuss any questions you have with your health care provider. Document Revised: 05/11/2022 Document Reviewed: 05/11/2022 Elsevier Patient Education  2024 [employer]. "

## 2024-05-20 ENCOUNTER — Encounter: Payer: Self-pay | Admitting: Internal Medicine

## 2024-05-21 ENCOUNTER — Ambulatory Visit: Admitting: Internal Medicine

## 2024-05-24 ENCOUNTER — Encounter: Payer: Self-pay | Admitting: Internal Medicine

## 2024-05-24 NOTE — Progress Notes (Signed)
 "   Subjective:    Patient ID: Ashley Norton, female    DOB: 01/22/1956, 69 y.o.   MRN: [id number]      HPI Ashley Norton is here for  Chief Complaint  Patient presents with   Sore Throat    Sore throat, feels like something is hung up in her throat; Nasal drainage since last Friday; Neck pain   Discussed the use of AI scribe software for clinical note transcription with the patient, who gave verbal consent to proceed.  History of Present Illness Ashley Norton is a 69 year old female who presents with a sore throat and nasal drainage.  She began experiencing a sore throat and nasal drainage last weekend. She has been using a twelve-hour decongestant, which has provided some relief. She feels like there is something in her throat, particularly on the left side, where she notes the absence of her tonsil, although she has never had her tonsils removed.  She describes soreness in her neck, which she attributes to muscle strain. She has been using a heating pad and Voltaren, and has taken a muscle relaxer, which provided some relief. The soreness is located under her ears and in the back of her neck, accompanied by cracking sounds when moving her neck.  She feels fatigued and weak but has not had a fever. She experiences nasal congestion and has been taking a twelve-hour decongestant, which she previously used during a COVID-19 infection. She has not been able to find a twenty-four-hour release decongestant and takes the decongestant in the morning to avoid sleep disturbances.  She reports occasional ear pain on one side, headaches, dizziness, and lightheadedness. No sinus pain, pressure, coughing, wheezing, shortness of breath, body aches, or stomach issues. She has been alternating between ibuprofen  and Tylenol  for pain relief and continues to take her vitamins and sleep medication, which has helped her sleep well.  She mentions a history of tonsil stones and believes she may have expelled  one recently. She noticed small clear spots on her tonsil earlier in the week, which she thought might be causing her throat discomfort. She has a history of cold sores and used to receive shots for them, but has not had them in a long time.      Medications and allergies reviewed with patient and updated if appropriate.  Medications Ordered Prior to Encounter[1]  Review of Systems  Constitutional:  Positive for fatigue. Negative for fever.  HENT:  Positive for congestion, postnasal drip and sore throat. Negative for ear pain, sinus pressure and sinus pain.   Respiratory:  Negative for cough, shortness of breath and wheezing.   Gastrointestinal:  Negative for diarrhea and nausea.  Musculoskeletal:  Positive for neck pain (muscle pain).  Neurological:  Positive for dizziness, light-headedness and headaches.       Objective:   Vitals:   05/25/24 1512  BP: 116/70  Pulse: 67  Temp: 98.2 F (36.8 C)  SpO2: 97%   BP Readings from Last 3 Encounters:  05/25/24 116/70  05/18/24 110/70  03/19/24 128/78   Wt Readings from Last 3 Encounters:  05/25/24 154 lb (69.9 kg)  05/21/24 154 lb (69.9 kg)  03/19/24 159 lb 3.2 oz (72.2 kg)   Body mass index is 24.86 kg/m.    Physical Exam Constitutional:      General: She is not in acute distress.    Appearance: Normal appearance. She is not ill-appearing.  HENT:     Head: Normocephalic and atraumatic.  Right Ear: Tympanic membrane, ear canal and external ear normal.     Left Ear: Tympanic membrane, ear canal and external ear normal.     Mouth/Throat:     Mouth: Mucous membranes are moist.     Pharynx: No oropharyngeal exudate or posterior oropharyngeal erythema.  Eyes:     Conjunctiva/sclera: Conjunctivae normal.  Cardiovascular:     Rate and Rhythm: Normal rate and regular rhythm.  Pulmonary:     Effort: Pulmonary effort is normal. No respiratory distress.     Breath sounds: Normal breath sounds. No wheezing or rales.   Musculoskeletal:     Cervical back: Neck supple. No tenderness.  Lymphadenopathy:     Cervical: No cervical adenopathy.  Skin:    General: Skin is warm and dry.  Neurological:     Mental Status: She is alert.            Assessment & Plan:    See Problem List for Assessment and Plan of chronic medical problems.    Assessment and Plan Assessment & Plan Acute pharyngitis Symptoms suggest viral upper respiratory infection. Discussed viral infection duration and contagiousness. Considered azithromycin  as treatment option. - Prescribed azithromycin  (Z-Pak)-discussed I think this still could be viral and to take this if no improvement. - Continue decongestant, ibuprofen , acetaminophen . - Encourage rest, hydration. - Consider chloraseptic throat spray. - Monitor symptoms, report progress before trip.       [1]  Current Outpatient Medications on File Prior to Visit  Medication Sig Dispense Refill   ALPRAZolam  (XANAX ) 0.25 MG tablet Take 1 tablet (0.25 mg total) by mouth 2 (two) times daily as needed. 30 tablet 5   ascorbic acid (VITAMIN C) 1000 MG tablet Vitamin C 1,000 mg tablet     buPROPion  (WELLBUTRIN  XL) 150 MG 24 hr tablet TAKE 1 TABLET BY MOUTH AT BEDTIME 90 tablet 3   cholecalciferol (VITAMIN D3) 25 MCG (1000 UNIT) tablet Take 1,000 Units by mouth daily. 2 tablets a day     ezetimibe  (ZETIA ) 10 MG tablet Take 1 tablet (10 mg total) by mouth daily. 90 tablet 3   fluticasone  (FLONASE ) 50 MCG/ACT nasal spray Place 1 spray into both nostrils daily. 15.8 mL 0   furosemide  (LASIX ) 20 MG tablet Take 1 tablet (20 mg total) by mouth daily as needed for edema. 90 tablet 0   HYDROcodone -acetaminophen  (NORCO/VICODIN) 5-325 MG tablet Take 1 tablet by mouth every 4 (four) hours as needed (pain).     hyoscyamine  (LEVSIN  SL) 0.125 MG SL tablet Place 1 tablet (0.125 mg total) under the tongue every 4 (four) hours as needed. 30 tablet 5   L-Theanine 200 MG CAPS Take 1 tablet by mouth at  bedtime.     Loratadine (CLARITIN PO) Take 10 mg by mouth daily as needed (allergies).     MAGNESIUM COMPLEX PO Take 1 capsule by mouth daily.     methocarbamol  (ROBAXIN ) 500 MG tablet Take 1 tablet (500 mg total) by mouth 2 (two) times daily as needed for muscle spasms. 20 tablet 0   metoprolol  tartrate (LOPRESSOR ) 25 MG tablet As needed for palpitations, heart racing. May take up to twice per day. 30 tablet 1   Multiple Vitamins-Minerals (MULTI FOR HER PO) Take by mouth.     ondansetron  (ZOFRAN ) 4 MG tablet Take 1 tablet (4 mg total) by mouth every 6 (six) hours as needed for nausea or vomiting. 30 tablet 1   PROCTO-MED HC 2.5 % rectal cream SMARTSIG:2 Topical Twice Daily  RETIN-A 0.05 % cream Apply topically at bedtime.     scopolamine  (TRANSDERM-SCOP) 1 MG/3DAYS Place 1 patch (1 mg total) onto the skin every 3 (three) days. 10 patch 0   traZODone  (DESYREL ) 50 MG tablet Take 1 tablet (50 mg total) by mouth at bedtime. 30 tablet 5   Zinc 30 MG TABS Take by mouth.     [DISCONTINUED] gabapentin  (NEURONTIN ) 300 MG capsule Take 1 capsule (300 mg total) by mouth at bedtime. (Patient not taking: Reported on 05/13/2015) 90 capsule 4   No current facility-administered medications on file prior to visit.   "

## 2024-05-25 ENCOUNTER — Ambulatory Visit (INDEPENDENT_AMBULATORY_CARE_PROVIDER_SITE_OTHER): Admitting: Internal Medicine

## 2024-05-25 VITALS — BP 116/70 | HR 67 | Temp 98.2°F | Ht 66.0 in | Wt 154.0 lb

## 2024-05-25 DIAGNOSIS — J029 Acute pharyngitis, unspecified: Secondary | ICD-10-CM | POA: Diagnosis not present

## 2024-05-25 MED ORDER — AZITHROMYCIN 250 MG PO TABS: ORAL_TABLET | ORAL | 0 refills | Status: AC

## 2024-05-25 NOTE — Patient Instructions (Addendum)
     Medications changes include :   zpak    Return if symptoms worsen or fail to improve.  

## 2024-06-26 ENCOUNTER — Ambulatory Visit

## 2024-07-06 ENCOUNTER — Ambulatory Visit (HOSPITAL_BASED_OUTPATIENT_CLINIC_OR_DEPARTMENT_OTHER): Admitting: Family

## 2024-07-16 ENCOUNTER — Ambulatory Visit: Admitting: Internal Medicine

## 2024-07-19 ENCOUNTER — Ambulatory Visit (HOSPITAL_COMMUNITY)

## 2024-07-19 ENCOUNTER — Encounter

## 2024-07-26 ENCOUNTER — Ambulatory Visit

## 2024-07-27 ENCOUNTER — Ambulatory Visit
